# Patient Record
Sex: Female | Born: 1960 | Race: Black or African American | Hispanic: No | Smoking: Never smoker
Health system: Southern US, Community
[De-identification: ages and names within clinical notes are randomized; demographics above are authoritative.]

## PROBLEM LIST (undated history)

## (undated) DIAGNOSIS — Z825 Family history of asthma and other chronic lower respiratory diseases: Secondary | ICD-10-CM

## (undated) DIAGNOSIS — I10 Essential (primary) hypertension: Secondary | ICD-10-CM

## (undated) DIAGNOSIS — G43009 Migraine without aura, not intractable, without status migrainosus: Secondary | ICD-10-CM

## (undated) DIAGNOSIS — J309 Allergic rhinitis, unspecified: Secondary | ICD-10-CM

## (undated) DIAGNOSIS — E0590 Thyrotoxicosis, unspecified without thyrotoxic crisis or storm: Secondary | ICD-10-CM

## (undated) DIAGNOSIS — R05 Cough: Secondary | ICD-10-CM

## (undated) DIAGNOSIS — R059 Cough, unspecified: Secondary | ICD-10-CM

## (undated) DIAGNOSIS — B079 Viral wart, unspecified: Secondary | ICD-10-CM

## (undated) DIAGNOSIS — Z780 Asymptomatic menopausal state: Secondary | ICD-10-CM

## (undated) DIAGNOSIS — J069 Acute upper respiratory infection, unspecified: Secondary | ICD-10-CM

## (undated) DIAGNOSIS — M25559 Pain in unspecified hip: Secondary | ICD-10-CM

## (undated) HISTORY — DX: Thyrotoxicosis, unspecified without thyrotoxic crisis or storm: E05.90

## (undated) HISTORY — DX: Pain in unspecified hip: M25.559

## (undated) HISTORY — DX: Viral wart, unspecified: B07.9

## (undated) HISTORY — DX: Essential (primary) hypertension: I10

## (undated) HISTORY — DX: Allergic rhinitis, unspecified: J30.9

## (undated) HISTORY — DX: Cough: R05

## (undated) HISTORY — DX: Acute upper respiratory infection, unspecified: J06.9

## (undated) HISTORY — DX: Cough, unspecified: R05.9

## (undated) HISTORY — DX: Family history of asthma and other chronic lower respiratory diseases: Z82.5

## (undated) HISTORY — DX: Asymptomatic menopausal state: Z78.0

## (undated) HISTORY — DX: Migraine without aura, not intractable, without status migrainosus: G43.009

---

## 1998-06-26 ENCOUNTER — Emergency Department (HOSPITAL_COMMUNITY): Admission: EM | Admit: 1998-06-26 | Discharge: 1998-06-26 | Payer: Self-pay | Admitting: Emergency Medicine

## 1998-07-29 ENCOUNTER — Other Ambulatory Visit: Admission: RE | Admit: 1998-07-29 | Discharge: 1998-07-29 | Payer: Self-pay | Admitting: *Deleted

## 1999-07-08 ENCOUNTER — Other Ambulatory Visit: Admission: RE | Admit: 1999-07-08 | Discharge: 1999-07-08 | Payer: Self-pay | Admitting: *Deleted

## 1999-07-20 ENCOUNTER — Encounter: Payer: Self-pay | Admitting: *Deleted

## 1999-07-20 ENCOUNTER — Encounter: Admission: RE | Admit: 1999-07-20 | Discharge: 1999-07-20 | Payer: Self-pay | Admitting: *Deleted

## 2000-07-03 ENCOUNTER — Other Ambulatory Visit: Admission: RE | Admit: 2000-07-03 | Discharge: 2000-07-03 | Payer: Self-pay | Admitting: *Deleted

## 2004-06-30 ENCOUNTER — Ambulatory Visit: Payer: Self-pay | Admitting: Internal Medicine

## 2004-06-30 ENCOUNTER — Inpatient Hospital Stay (HOSPITAL_COMMUNITY): Admission: EM | Admit: 2004-06-30 | Discharge: 2004-07-01 | Payer: Self-pay | Admitting: Emergency Medicine

## 2004-07-07 ENCOUNTER — Ambulatory Visit: Payer: Self-pay | Admitting: Internal Medicine

## 2004-07-14 ENCOUNTER — Inpatient Hospital Stay (HOSPITAL_COMMUNITY): Admission: EM | Admit: 2004-07-14 | Discharge: 2004-07-20 | Payer: Self-pay | Admitting: Internal Medicine

## 2004-07-14 ENCOUNTER — Ambulatory Visit: Payer: Self-pay | Admitting: Internal Medicine

## 2004-07-14 ENCOUNTER — Ambulatory Visit: Payer: Self-pay | Admitting: Gastroenterology

## 2004-07-28 ENCOUNTER — Ambulatory Visit: Payer: Self-pay | Admitting: Internal Medicine

## 2004-08-03 ENCOUNTER — Ambulatory Visit: Payer: Self-pay | Admitting: Internal Medicine

## 2004-08-10 ENCOUNTER — Ambulatory Visit: Payer: Self-pay | Admitting: Internal Medicine

## 2004-08-10 ENCOUNTER — Inpatient Hospital Stay (HOSPITAL_COMMUNITY): Admission: EM | Admit: 2004-08-10 | Discharge: 2004-08-13 | Payer: Self-pay | Admitting: Emergency Medicine

## 2004-08-11 ENCOUNTER — Ambulatory Visit: Payer: Self-pay | Admitting: Internal Medicine

## 2004-08-22 ENCOUNTER — Ambulatory Visit: Payer: Self-pay | Admitting: Internal Medicine

## 2004-10-31 ENCOUNTER — Ambulatory Visit: Payer: Self-pay | Admitting: Internal Medicine

## 2005-05-05 ENCOUNTER — Ambulatory Visit: Payer: Self-pay | Admitting: Internal Medicine

## 2005-08-16 ENCOUNTER — Other Ambulatory Visit: Admission: RE | Admit: 2005-08-16 | Discharge: 2005-08-16 | Payer: Self-pay | Admitting: Obstetrics and Gynecology

## 2006-07-04 IMAGING — US US ABDOMEN COMPLETE
1 series · 14 of 25 positions shown · non-contrast
Comparison: CT 07/16/2004

CLINICAL DATA: Elevated amylase and lipase, nausea and vomiting, severe
abdominal pain

ABDOMEN ULTRASOUND:
TECHNIQUE: Complete abdominal ultrasound examination was performed including
evaluation of the liver, gallbladder, bile ducts, pancreas, kidneys, spleen,
IVC, and abdominal aorta.

[Series 1: unknown · 0.28mm/px · 14 of 83 slices shown]
[im 1/83]
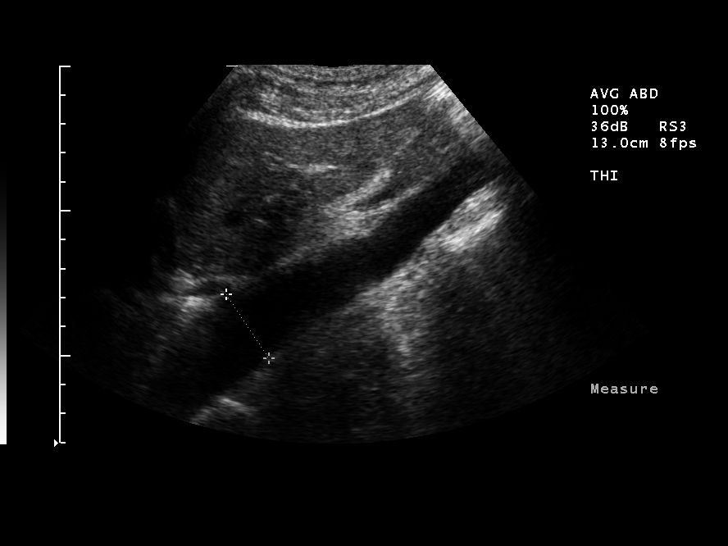
[im 7/83]
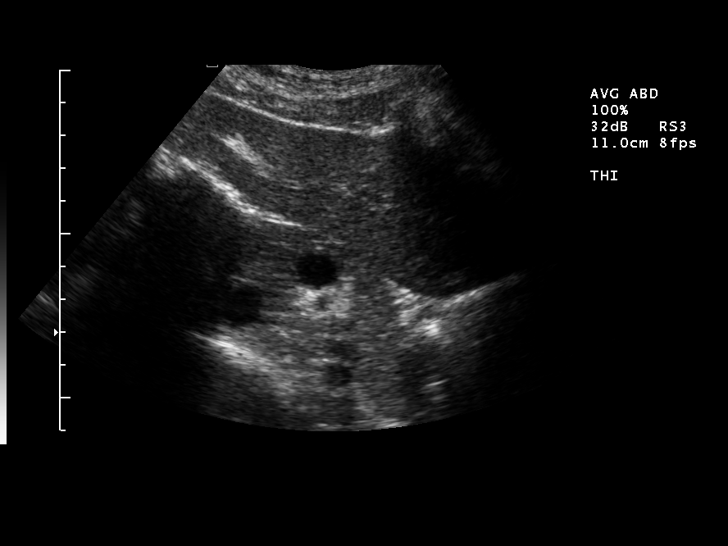
[im 14/83]
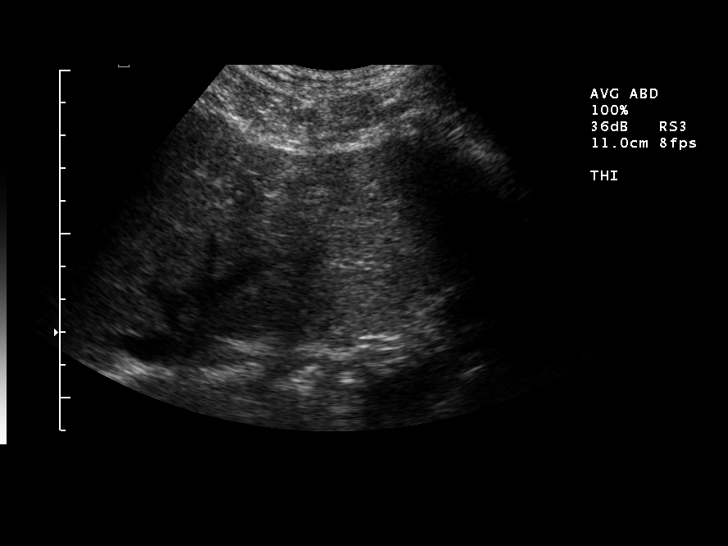
[im 21/83]
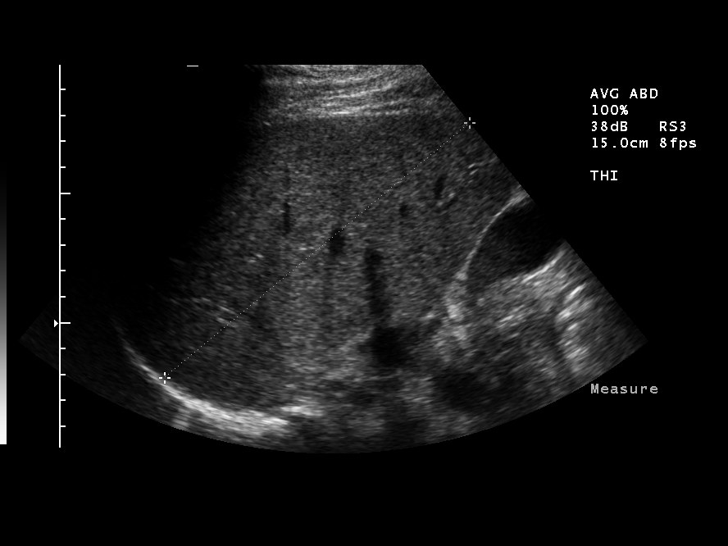
[im 28/83]
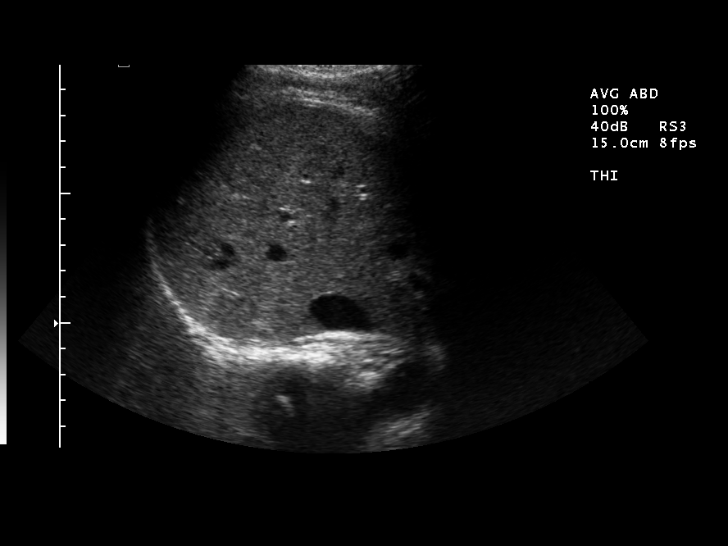
[im 31/83]
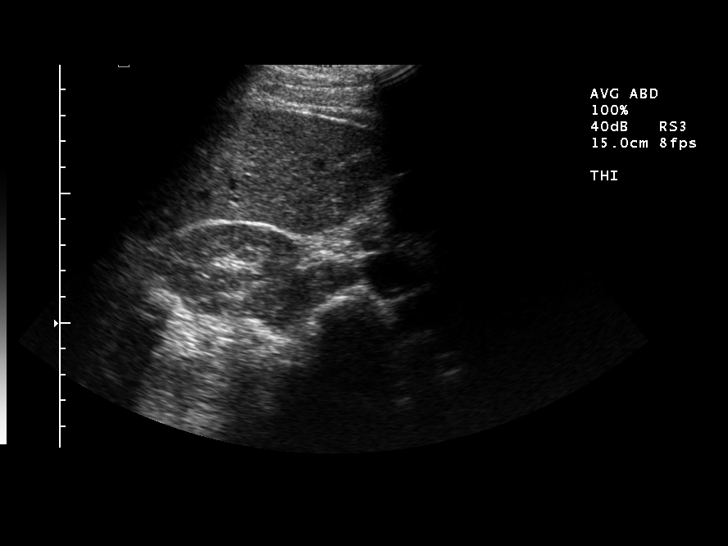
[im 38/83]
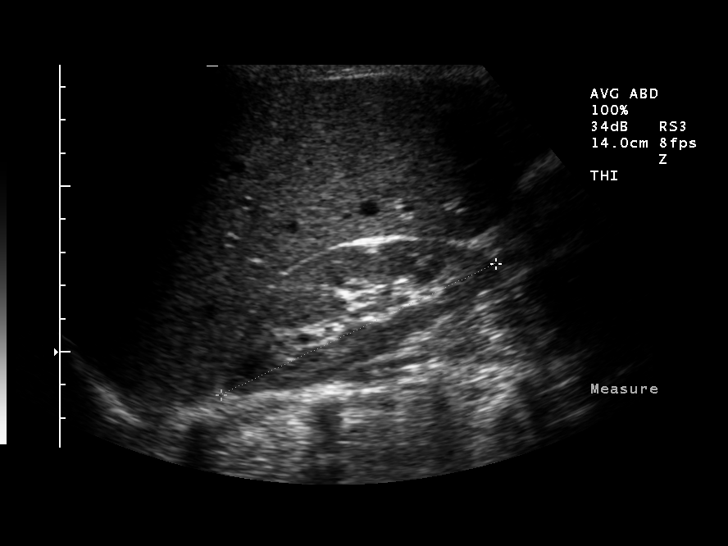
[im 45/83]
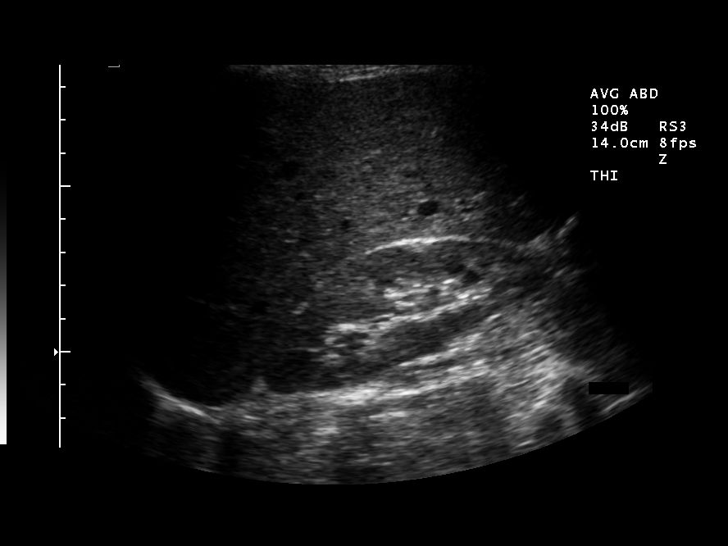
[im 52/83]
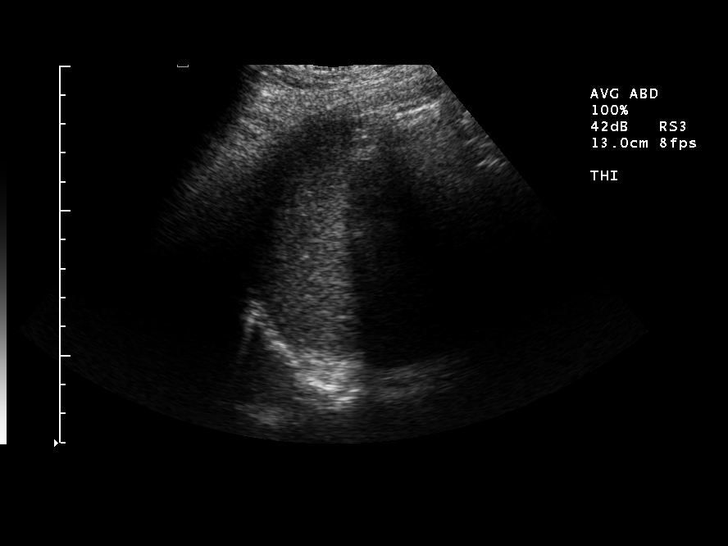
[im 55/83]
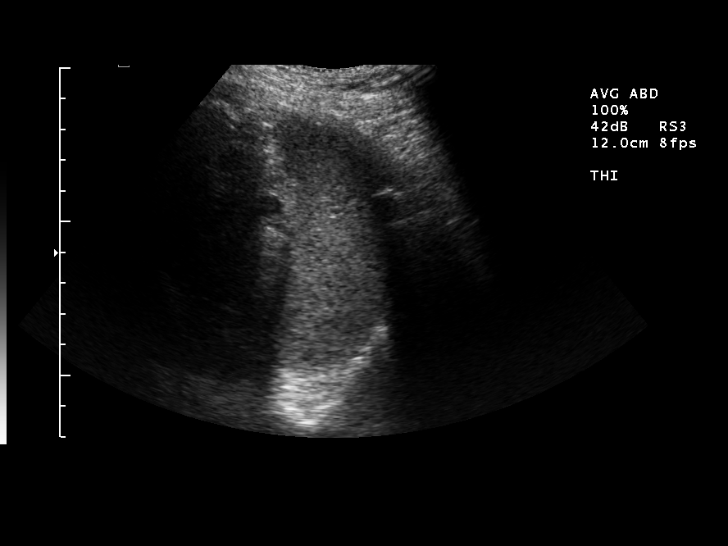
[im 62/83]
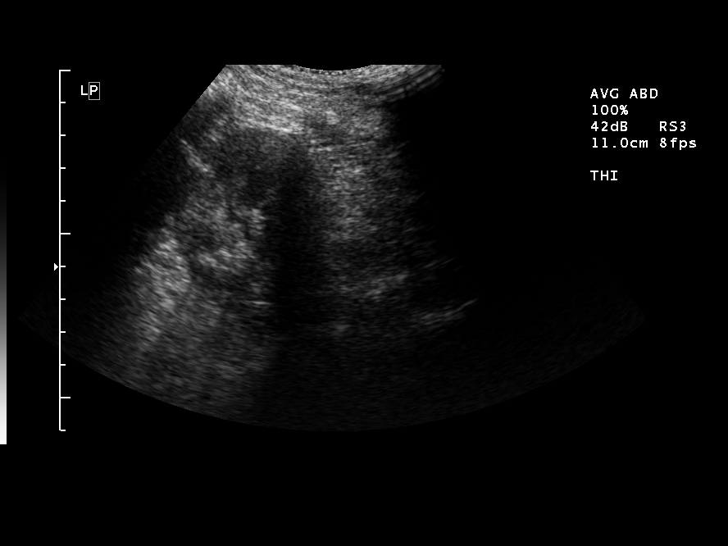
[im 69/83]
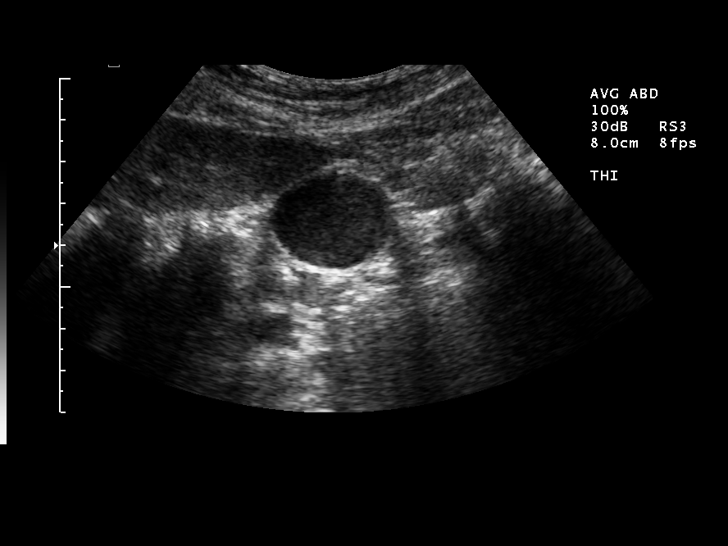
[im 76/83]
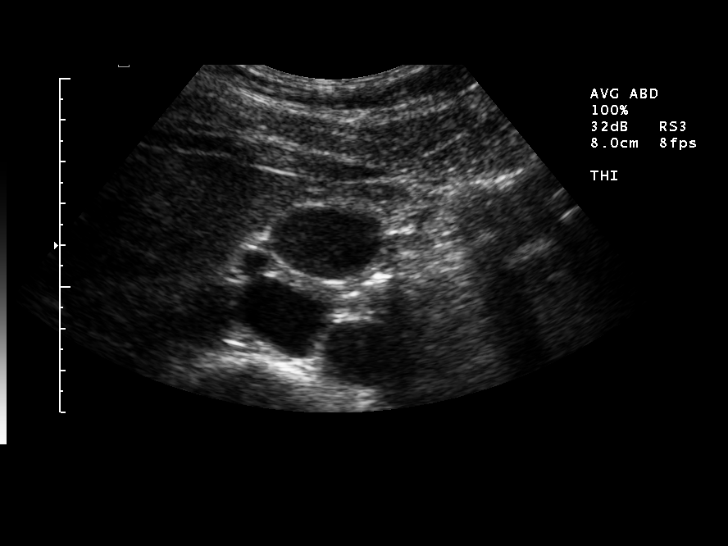
[im 83/83]
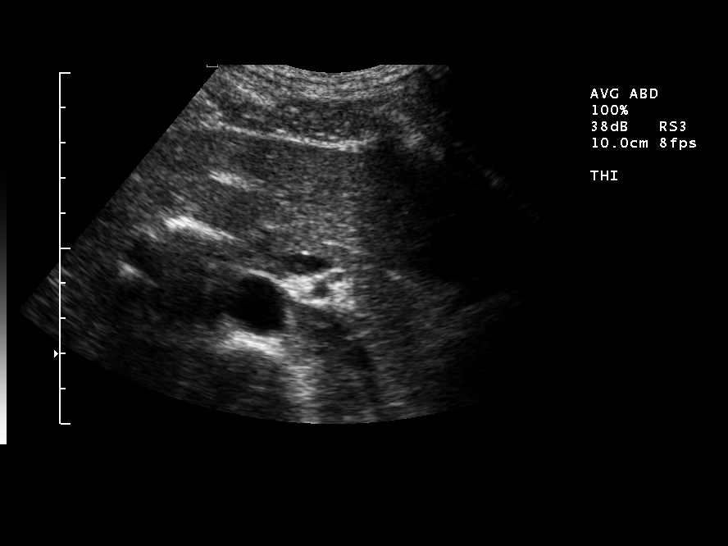

[14 of 25 positions shown; findings below may reference images not displayed]

FINDINGS: Gallbladder: Sludge present, no stones. Gallbladder wall normal.

Common bile duct: Normal, 2.6 mm.

Liver: Normal. 

IVC: Normal. 

Pancreas: Normal.

Spleen: Normal.

Right kidney: Normal,  9.6 cm.

Left kidney: Normal, 9.5 cm

Abdominal aorta: Normal.
IMPRESSION: Sludge within the gallbladder. No evidence of gallstones or acute cholecystitis.

## 2006-08-03 ENCOUNTER — Ambulatory Visit: Payer: Self-pay | Admitting: Internal Medicine

## 2006-08-03 LAB — CONVERTED CEMR LAB
ALT: 12 units/L (ref 0–40)
AST: 19 units/L (ref 0–37)
Albumin: 3.4 g/dL — ABNORMAL LOW (ref 3.5–5.2)
Basophils Absolute: 0 10*3/uL (ref 0.0–0.1)
Basophils Relative: 0.8 % (ref 0.0–1.0)
Calcium: 9.1 mg/dL (ref 8.4–10.5)
Chloride: 111 meq/L (ref 96–112)
Eosinophils Relative: 1.1 % (ref 0.0–5.0)
GFR calc non Af Amer: 72 mL/min
HCT: 36.7 % (ref 36.0–46.0)
Hemoglobin, Urine: NEGATIVE
LDL Cholesterol: 29 mg/dL (ref 0–99)
MCHC: 33.2 g/dL (ref 30.0–36.0)
Neutrophils Relative %: 57.9 % (ref 43.0–77.0)
RBC: 4.4 M/uL (ref 3.87–5.11)
RDW: 13.7 % (ref 11.5–14.6)
Specific Gravity, Urine: 1.01 (ref 1.000–1.03)
Total CHOL/HDL Ratio: 1.5
Total Protein, Urine: NEGATIVE mg/dL
VLDL: 10 mg/dL (ref 0–40)
WBC: 5.5 10*3/uL (ref 4.5–10.5)
pH: 6 (ref 5.0–8.0)

## 2007-05-22 ENCOUNTER — Telehealth (INDEPENDENT_AMBULATORY_CARE_PROVIDER_SITE_OTHER): Payer: Self-pay | Admitting: *Deleted

## 2007-05-23 ENCOUNTER — Ambulatory Visit: Payer: Self-pay | Admitting: Internal Medicine

## 2007-05-23 DIAGNOSIS — J309 Allergic rhinitis, unspecified: Secondary | ICD-10-CM | POA: Insufficient documentation

## 2007-05-23 DIAGNOSIS — M25559 Pain in unspecified hip: Secondary | ICD-10-CM

## 2007-05-23 DIAGNOSIS — I10 Essential (primary) hypertension: Secondary | ICD-10-CM | POA: Insufficient documentation

## 2007-05-23 DIAGNOSIS — J069 Acute upper respiratory infection, unspecified: Secondary | ICD-10-CM | POA: Insufficient documentation

## 2007-08-29 ENCOUNTER — Telehealth: Payer: Self-pay | Admitting: Internal Medicine

## 2007-09-06 ENCOUNTER — Telehealth: Payer: Self-pay | Admitting: Internal Medicine

## 2007-09-21 ENCOUNTER — Ambulatory Visit: Payer: Self-pay | Admitting: Family Medicine

## 2007-09-21 DIAGNOSIS — G43009 Migraine without aura, not intractable, without status migrainosus: Secondary | ICD-10-CM | POA: Insufficient documentation

## 2007-09-23 ENCOUNTER — Telehealth: Payer: Self-pay | Admitting: Internal Medicine

## 2007-12-09 ENCOUNTER — Ambulatory Visit: Payer: Self-pay | Admitting: Internal Medicine

## 2007-12-09 DIAGNOSIS — R05 Cough: Secondary | ICD-10-CM

## 2007-12-13 LAB — CONVERTED CEMR LAB
Alkaline Phosphatase: 135 units/L — ABNORMAL HIGH (ref 39–117)
Basophils Absolute: 0 10*3/uL (ref 0.0–0.1)
Bilirubin Urine: NEGATIVE
Bilirubin, Direct: 0.1 mg/dL (ref 0.0–0.3)
GFR calc Af Amer: 87 mL/min
Glucose, Bld: 96 mg/dL (ref 70–99)
HCT: 36.5 % (ref 36.0–46.0)
HDL: 69.2 mg/dL (ref >39.0)
Hemoglobin, Urine: NEGATIVE
Leukocytes, UA: NEGATIVE
Lymphocytes Relative: 37.9 % (ref 12.0–46.0)
Monocytes Absolute: 0.5 10*3/uL (ref 0.1–1.0)
Monocytes Relative: 7.9 % (ref 3.0–12.0)
Nitrite: NEGATIVE
Platelets: 238 10*3/uL (ref 150–400)
Potassium: 3.9 meq/L (ref 3.5–5.1)
RDW: 13.2 % (ref 11.5–14.6)
Sodium: 142 meq/L (ref 135–145)
Total Bilirubin: 0.6 mg/dL (ref 0.3–1.2)
Total CHOL/HDL Ratio: 1.7
Total Protein: 7.3 g/dL (ref 6.0–8.3)
VLDL: 8 mg/dL (ref 0–40)

## 2008-01-09 ENCOUNTER — Ambulatory Visit: Payer: Self-pay | Admitting: Internal Medicine

## 2008-01-10 ENCOUNTER — Encounter: Payer: Self-pay | Admitting: Endocrinology

## 2008-01-10 LAB — CONVERTED CEMR LAB
ALT: 26 units/L (ref 0–35)
AST: 30 units/L (ref 0–37)
Albumin: 3.7 g/dL (ref 3.5–5.2)
Alkaline Phosphatase: 136 units/L — ABNORMAL HIGH (ref 39–117)
BUN: 8 mg/dL (ref 6–23)
Bilirubin, Direct: 0.1 mg/dL (ref 0.0–0.3)
CO2: 28 meq/L (ref 19–32)
Chloride: 108 meq/L (ref 96–112)
GFR calc Af Amer: 77 mL/min
Glucose, Bld: 101 mg/dL — ABNORMAL HIGH (ref 70–99)
Potassium: 3.9 meq/L (ref 3.5–5.1)
Total Protein: 6.8 g/dL (ref 6.0–8.3)

## 2008-01-14 ENCOUNTER — Telehealth: Payer: Self-pay | Admitting: Internal Medicine

## 2008-02-10 ENCOUNTER — Ambulatory Visit: Payer: Self-pay | Admitting: Endocrinology

## 2008-02-17 ENCOUNTER — Encounter (HOSPITAL_COMMUNITY): Admission: RE | Admit: 2008-02-17 | Discharge: 2008-03-05 | Payer: Self-pay | Admitting: Endocrinology

## 2008-02-28 ENCOUNTER — Ambulatory Visit: Payer: Self-pay | Admitting: Endocrinology

## 2008-02-28 DIAGNOSIS — Z780 Asymptomatic menopausal state: Secondary | ICD-10-CM | POA: Insufficient documentation

## 2008-03-06 ENCOUNTER — Ambulatory Visit (HOSPITAL_COMMUNITY): Admission: RE | Admit: 2008-03-06 | Discharge: 2008-03-06 | Payer: Self-pay | Admitting: Endocrinology

## 2008-05-01 ENCOUNTER — Ambulatory Visit: Payer: Self-pay | Admitting: Endocrinology

## 2008-05-01 DIAGNOSIS — E0500 Thyrotoxicosis with diffuse goiter without thyrotoxic crisis or storm: Secondary | ICD-10-CM | POA: Insufficient documentation

## 2008-05-01 DIAGNOSIS — E039 Hypothyroidism, unspecified: Secondary | ICD-10-CM | POA: Insufficient documentation

## 2008-05-29 ENCOUNTER — Ambulatory Visit: Payer: Self-pay | Admitting: Endocrinology

## 2008-06-07 ENCOUNTER — Emergency Department (HOSPITAL_COMMUNITY): Admission: EM | Admit: 2008-06-07 | Discharge: 2008-06-07 | Payer: Self-pay | Admitting: Emergency Medicine

## 2008-06-23 ENCOUNTER — Telehealth (INDEPENDENT_AMBULATORY_CARE_PROVIDER_SITE_OTHER): Payer: Self-pay | Admitting: *Deleted

## 2008-07-13 ENCOUNTER — Ambulatory Visit: Payer: Self-pay | Admitting: Endocrinology

## 2008-07-13 LAB — CONVERTED CEMR LAB: TSH: 0.07 microintl units/mL — ABNORMAL LOW (ref 0.35–5.50)

## 2008-10-23 ENCOUNTER — Ambulatory Visit: Payer: Self-pay | Admitting: Endocrinology

## 2008-10-23 LAB — CONVERTED CEMR LAB: TSH: 0.03 microintl units/mL — ABNORMAL LOW (ref 0.35–5.50)

## 2009-02-01 ENCOUNTER — Telehealth: Payer: Self-pay | Admitting: Endocrinology

## 2009-02-02 ENCOUNTER — Telehealth: Payer: Self-pay | Admitting: Internal Medicine

## 2009-02-19 ENCOUNTER — Ambulatory Visit: Payer: Self-pay | Admitting: Internal Medicine

## 2009-02-19 DIAGNOSIS — B079 Viral wart, unspecified: Secondary | ICD-10-CM | POA: Insufficient documentation

## 2009-02-23 LAB — CONVERTED CEMR LAB
Chloride: 109 meq/L (ref 96–112)
GFR calc non Af Amer: 76.36 mL/min (ref >60)
Potassium: 4 meq/L (ref 3.5–5.1)
TSH: 0.14 microintl units/mL — ABNORMAL LOW (ref 0.35–5.50)

## 2009-02-24 ENCOUNTER — Telehealth: Payer: Self-pay | Admitting: Internal Medicine

## 2009-02-25 ENCOUNTER — Telehealth: Payer: Self-pay | Admitting: Internal Medicine

## 2009-03-10 ENCOUNTER — Telehealth: Payer: Self-pay | Admitting: Internal Medicine

## 2009-04-02 ENCOUNTER — Ambulatory Visit: Payer: Self-pay | Admitting: Internal Medicine

## 2009-04-02 LAB — CONVERTED CEMR LAB
CO2: 29 meq/L (ref 19–32)
Calcium: 9.1 mg/dL (ref 8.4–10.5)
GFR calc non Af Amer: 76.32 mL/min (ref >60)
Glucose, Bld: 98 mg/dL (ref 70–99)
Potassium: 3.8 meq/L (ref 3.5–5.1)
Sodium: 138 meq/L (ref 135–145)

## 2009-04-12 ENCOUNTER — Telehealth: Payer: Self-pay | Admitting: Internal Medicine

## 2009-05-21 ENCOUNTER — Ambulatory Visit: Payer: Self-pay | Admitting: Internal Medicine

## 2009-06-04 ENCOUNTER — Telehealth: Payer: Self-pay | Admitting: Internal Medicine

## 2009-07-14 ENCOUNTER — Telehealth: Payer: Self-pay | Admitting: Internal Medicine

## 2009-08-17 ENCOUNTER — Telehealth: Payer: Self-pay | Admitting: Internal Medicine

## 2009-08-19 ENCOUNTER — Ambulatory Visit: Payer: Self-pay | Admitting: Internal Medicine

## 2009-10-25 ENCOUNTER — Telehealth: Payer: Self-pay | Admitting: Internal Medicine

## 2009-11-09 ENCOUNTER — Telehealth: Payer: Self-pay | Admitting: Internal Medicine

## 2010-01-17 ENCOUNTER — Ambulatory Visit: Payer: Self-pay | Admitting: Internal Medicine

## 2010-01-18 ENCOUNTER — Encounter: Payer: Self-pay | Admitting: Internal Medicine

## 2010-01-18 LAB — CONVERTED CEMR LAB
ALT: 21 units/L (ref 0–35)
AST: 28 units/L (ref 0–37)
Albumin: 4.1 g/dL (ref 3.5–5.2)
BUN: 13 mg/dL (ref 6–23)
Chloride: 102 meq/L (ref 96–112)
Cholesterol: 144 mg/dL (ref 0–200)
Eosinophils Relative: 1.5 % (ref 0.0–5.0)
GFR calc non Af Amer: 69.6 mL/min (ref >60)
Glucose, Bld: 95 mg/dL (ref 70–99)
HCT: 35.4 % — ABNORMAL LOW (ref 36.0–46.0)
Hemoglobin: 12.1 g/dL (ref 12.0–15.0)
Lymphs Abs: 3.3 10*3/uL (ref 0.7–4.0)
MCV: 82.6 fL (ref 78.0–100.0)
Monocytes Absolute: 0.7 10*3/uL (ref 0.1–1.0)
Monocytes Relative: 9.6 % (ref 3.0–12.0)
Neutro Abs: 3.2 10*3/uL (ref 1.4–7.7)
Platelets: 239 10*3/uL (ref 150.0–400.0)
Potassium: 3.3 meq/L — ABNORMAL LOW (ref 3.5–5.1)
Sodium: 141 meq/L (ref 135–145)
TSH: 2.52 microintl units/mL (ref 0.35–5.50)
Total Protein: 7.4 g/dL (ref 6.0–8.3)
WBC: 7.3 10*3/uL (ref 4.5–10.5)

## 2010-04-25 ENCOUNTER — Telehealth: Payer: Self-pay | Admitting: Internal Medicine

## 2010-07-11 ENCOUNTER — Encounter: Payer: Self-pay | Admitting: Endocrinology

## 2010-07-19 NOTE — Progress Notes (Signed)
Summary: vitamin b12  Phone Note Call from Patient   Caller: Daughter-Angela [phone] Summary of Call: Patient daughter called stating that she would like to know if ok for her to use a vitamin b12 suppliment? Please advise Initial call taken by: Rock Nephew CMA,  Oct 25, 2009 3:39 PM  Follow-up for Phone Call        ok Follow-up by: Tresa Garter MD,  Oct 25, 2009 11:13 PM  Additional Follow-up for Phone Call Additional follow up Details #1::        left mess to call office back ..........Marland KitchenLamar Sprinkles, CMA  Oct 26, 2009 8:34 AM   Pt informed  Additional Follow-up by: Lamar Sprinkles, CMA,  Oct 26, 2009 1:53 PM

## 2010-07-19 NOTE — Miscellaneous (Signed)
  Clinical Lists Changes  Medications: Added new medication of KLOR-CON M20 20 MEQ CR-TABS (POTASSIUM CHLORIDE CRYS CR) 1 once daily - Signed Rx of KLOR-CON M20 20 MEQ CR-TABS (POTASSIUM CHLORIDE CRYS CR) 1 once daily;  #30 x 6;  Signed;  Entered by: Lanier Prude, CMA(AAMA);  Authorized by: Tresa Garter MD;  Method used: Electronically to [facility] [address], [place], [place], [place]  [address], Ph: [phone], Fax: [phone]    Prescriptions: KLOR-CON M20 20 MEQ CR-TABS (POTASSIUM CHLORIDE CRYS CR) 1 once daily  #30 x 6   Entered by:   Lanier Prude, CMA(AAMA)   Authorized by:   Tresa Garter MD   Signed by:   Lanier Prude, [facility]) on 01/18/2010   Method used:   Electronically to        [facility] (retail)       [address]       [place], [place]  [address]       Ph: [phone]       Fax: [phone]   RxID:   [id number]

## 2010-07-19 NOTE — Progress Notes (Signed)
Summary: OV THURSDAY  Phone Note Call from Patient   Summary of Call: PT's daughter called. Pt c/o chills, cough and sinus congestion. I advised as previous phone note for symptom management. Scheduled for office visit Thursday am if not better tomorrow, also asked for her to call office if symptoms become worse. Daughter agreed.  Initial call taken by: Lamar Sprinkles, CMA,  August 17, 2009 5:44 PM  Follow-up for Phone Call        Agree. Thx Follow-up by: Tresa Garter MD,  August 18, 2009 1:18 PM

## 2010-07-19 NOTE — Progress Notes (Signed)
  Phone Note Refill Request Message from:  Patient on Nov 09, 2009 9:03 AM  Refills Requested: Medication #1:  SYNTHROID 75 MCG TABS 1/2 once daily Pt requesting refill on this medication. Should you refilll or Dr Everardo All.   Initial call taken by: Ami Bullins CMA,  Nov 09, 2009 9:03 AM  Follow-up for Phone Call        ok 12 ref Follow-up by: Tresa Garter MD,  Nov 09, 2009 5:42 PM    Prescriptions: SYNTHROID 75 MCG TABS (LEVOTHYROXINE SODIUM) 1/2 once daily  #45 x 3   Entered by:   Lamar Sprinkles, CMA   Authorized by:   Tresa Garter MD   Signed by:   Lamar Sprinkles, CMA on 11/10/2009   Method used:   Electronically to        [facility] (retail)       [address]       [place], [place]  [address]       Ph: [phone]       Fax: [phone]   RxID:   [id number]   Appended Document:  Daughter is aware

## 2010-07-19 NOTE — Progress Notes (Signed)
Summary: samples  Phone Note Refill Request Message from:  Patient  Refills Requested: Medication #1:  TRIBENZOR 40-10-25 MG TABS 1 by mouth qd   Last Refilled: 05/21/2009 Pt's daughter, Tyniah Kastens, called to see if we had samples of BP meds.  Initial call taken by: Alysia Penna,  April 25, 2010 1:47 PM  Follow-up for Phone Call        samples pulled. called pt back. no answer.  Follow-up by: Alysia Penna,  April 25, 2010 1:51 PM  Additional Follow-up for Phone Call Additional follow up Details #1::        Pt advised via VM that req'd sample are available for pick up Additional Follow-up by: Margaret Pyle, CMA,  April 26, 2010 10:46 AM    Prescriptions: Marya Landry 40-10-25 MG TABS [facility]) 1 by mouth qd  #14 x 0   Entered by:   Alysia Penna   Authorized by:   Tresa Garter MD   Signed by:   Alysia Penna on 04/25/2010   Method used:   Samples Given   RxID:   [id number]   Appended Document: samples Pt informed to pick up

## 2010-07-19 NOTE — Assessment & Plan Note (Signed)
Summary: cough, sinus congestion / SD   Vital Signs:  Patient profile:   50 year old female Weight:      170 pounds BMI:     30.22 Temp:     98.2 degrees F oral Pulse rate:   109 / minute BP sitting:   114 / 88  (left arm)  Vitals Entered By: Tora Perches (August 19, 2009 8:30 AM)  Procedure Note  Wart Removal: The patient complains of irritation. Consent signed: no  Procedure # 1: cryotherapy    Region: Palmar and dorsal     Location: B hands    # lesions removed: 15    Technique: liquid N2    Comment: Risks including but not limited by incomplete procedure, bleeding, infection, recurrence were discussed with the patient. verbal  Consent  was obtained..   Additional Instructions: Tolerated well. Complicatons - none.   CC: cough, sinus Is Patient Diabetic? No   CC:  cough and sinus.  History of Present Illness: The patient presents with complaints of sore throat, fever, cough, sinus congestion and drainge of several days duration. Not better with OTC meds. Chest hurts with coughing. Can't sleep due to cough. Muscle aches are present.  The mucus is colored.  C/o warts  Preventive Screening-Counseling & Management  Alcohol-Tobacco     Smoking Status: never  Current Medications (verified): 1)  Tribenzor 40-10-25 Mg Tabs (Olmesartan-Amlodipine-Hctz) .Marland Kitchen.. 1 By Mouth Qd 2)  Naproxen 500 Mg Tabs (Naproxen) .Marland Kitchen.. 1 By Mouth Two Times A Day Pc Prn 3)  Lotrel 5-20 Mg  Caps (Amlodipine Besy-Benazepril Hcl) .... 2 By Mouth Qd 4)  Flonase 50 Mcg/act  Susp (Fluticasone Propionate) .... 2 Sprays in Each Nostril Daily 5)  Zyrtec Allergy 10 Mg  Tabs (Cetirizine Hcl) .Marland Kitchen.. 1 By Mouth Qd 6)  Estradiol 0.5 Mg Tabs (Estradiol) .... Once Daily 7)  Medroxyprogesterone Acetate 2.5 Mg Tabs (Medroxyprogesterone Acetate) .... Once Daily 8)  Synthroid 75 Mcg Tabs (Levothyroxine Sodium) .... 1/2 Once Daily 9)  Vitamin E 400 Unit Caps (Vitamin E) .Marland Kitchen.. 1 By Mouth Qd 10)  Vitamin D3 1000 Unit   Tabs (Cholecalciferol) .Marland Kitchen.. 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  Lungs:  clear to auscultation.  no respiratory distress  Heart:  regular rate and rhythm, S1, S2 without murmurs, rubs, gallops, or clicks Abdomen:  Bowel sounds positive,abdomen soft and non-tender without masses, organomegaly or hernias noted. Skin:  hand warts B x 15 Psych:  Oriented X3.     Impression & Recommendations:  Problem # 1:  UPPER RESPIRATORY INFECTION (URI) (ICD-465.9) Assessment New  Her updated medication list for this problem includes:    Naproxen 500 Mg Tabs (Naproxen) .Marland Kitchen... 1 by mouth two times a day pc prn    Zyrtec Allergy 10 Mg Tabs (Cetirizine hcl) .Marland Kitchen... 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 1 by mouth qd 2)  Naproxen 500 Mg Tabs (Naproxen) .Marland Kitchen.. 1 by mouth two times a day pc prn 3)  Lotrel  5-20 Mg Caps (Amlodipine besy-benazepril hcl) .... 2 by mouth qd 4)  Flonase 50 Mcg/act Susp (Fluticasone propionate) .... 2 sprays in each nostril daily 5)  Zyrtec Allergy 10 Mg Tabs (Cetirizine hcl) .Marland Kitchen.. 1 by mouth qd 6)  Estradiol 0.5 Mg Tabs (Estradiol) .... Once daily 7)  Medroxyprogesterone Acetate 2.5 Mg Tabs (Medroxyprogesterone acetate) .... Once daily 8)  Synthroid 75 Mcg Tabs (Levothyroxine sodium) .... 1/2 once daily 9)  Vitamin E 400 Unit Caps (Vitamin e) .Marland Kitchen.. 1 by mouth qd 10)  Vitamin D3 1000 Unit Tabs (Cholecalciferol) .Marland Kitchen.. 1 qd 11)  Proair Hfa 108 (90 Base) Mcg/act Aers (Albuterol sulfate) .... 2 inh qid as needed 12)  Zithromax Z-pak 250 Mg Tabs (Azithromycin) .... As dirrected 13)  Tessalon Perles 100 Mg Caps (Benzonatate) .Marland Kitchen.. 1-2 by mouth two times a day as needed cogh  Patient Instructions: 1)  Call if you are not better in a reasonable amount of time or if worse.  2)  Please schedule a follow-up appointment in 6 months well. Prescriptions: TESSALON PERLES 100 MG CAPS (BENZONATATE) 1-2 by mouth two times a day as needed cogh  #120 x 1   Entered and Authorized by:   Tresa Garter MD   Signed by:   Tresa Garter MD on 08/19/2009   Method used:   Print then Give to Patient   RxID:   [id number] NFAOZHYQM Z-PAK 250 MG TABS (AZITHROMYCIN) as dirrected  #1 x 0   Entered and Authorized by:   Tresa Garter MD   Signed by:   Tresa Garter MD on 08/19/2009   Method used:   Print then Give to Patient   RxID:   [id number] PROAIR HFA 108 (90 BASE) MCG/ACT AERS (ALBUTEROL SULFATE) 2 inh qid as needed  #3 x 3   Entered and Authorized by:   Tresa Garter MD   Signed by:   Tresa Garter MD on 08/19/2009   Method used:   Print then Give to Patient   RxID:   [id number]

## 2010-07-19 NOTE — Progress Notes (Signed)
Summary: flu symptoms  Phone Note Call from Patient Call back at Work Phone [phone]   Summary of Call: Patient daughter left message on triage that her mother has been having flu like symptoms for several days. Per daughter, the patient has had chills, body aches, fever, runny nose, and coughing up green flem. Also, per daughter, the patient was coughing up some blood a few days ago, and blood has since stopped. Please advise if the patient needs prescription or office visit? Initial call taken by: Lucious Groves,  July 14, 2009 1:28 PM  Follow-up for Phone Call        Use over-the-counter medicines for "cold": Tylenol  650mg  or Advil 400mg  every 6 hours  for fever; Delsym or Robutussin for cough. Mucinex or Mucinex D for congestion. Ricola or Halls for sore throat. Office visit if not better or if worse.  OV tomorrow if sick - any MD Follow-up by: Tresa Garter MD,  July 14, 2009 4:07 PM  Additional Follow-up for Phone Call Additional follow up Details #1::        left mess to call office back .....................Marland KitchenLamar Sprinkles, CMA  July 15, 2009 6:10 PM   left mess to call office back.........................Marland KitchenLamar Sprinkles, CMA  July 16, 2009 12:08 PM     Additional Follow-up for Phone Call Additional follow up Details #2::    Pt's daughter left vm with triage, req that we leave a vm for pt on wk # b/c pt is at wk till 6pm. Left vm wit above info Follow-up by: Lamar Sprinkles, CMA,  July 16, 2009 3:42 PM

## 2010-07-20 ENCOUNTER — Emergency Department (HOSPITAL_COMMUNITY)
Admission: EM | Admit: 2010-07-20 | Discharge: 2010-07-21 | Disposition: A | Discharge status: Home / Self Care | Payer: BC Managed Care – PPO | Attending: Emergency Medicine | Admitting: Emergency Medicine

## 2010-07-20 ENCOUNTER — Emergency Department (HOSPITAL_COMMUNITY): Payer: BC Managed Care – PPO

## 2010-07-20 DIAGNOSIS — R509 Fever, unspecified: Secondary | ICD-10-CM | POA: Insufficient documentation

## 2010-07-20 DIAGNOSIS — J189 Pneumonia, unspecified organism: Secondary | ICD-10-CM | POA: Insufficient documentation

## 2010-07-20 DIAGNOSIS — E039 Hypothyroidism, unspecified: Secondary | ICD-10-CM | POA: Insufficient documentation

## 2010-07-20 DIAGNOSIS — I10 Essential (primary) hypertension: Secondary | ICD-10-CM | POA: Insufficient documentation

## 2010-07-20 DIAGNOSIS — R000 Tachycardia, unspecified: Secondary | ICD-10-CM | POA: Insufficient documentation

## 2010-07-21 LAB — DIFFERENTIAL
Basophils Relative: 0 % (ref 0–1)
Lymphocytes Relative: 7 % — ABNORMAL LOW (ref 12–46)
Lymphs Abs: 1.3 10*3/uL (ref 0.7–4.0)
Monocytes Absolute: 1.5 10*3/uL — ABNORMAL HIGH (ref 0.1–1.0)
Monocytes Relative: 8 % (ref 3–12)
Neutro Abs: 15 10*3/uL — ABNORMAL HIGH (ref 1.7–7.7)
Neutrophils Relative %: 84 % — ABNORMAL HIGH (ref 43–77)

## 2010-07-21 LAB — CBC
HCT: 30.5 % — ABNORMAL LOW (ref 36.0–46.0)
Hemoglobin: 10.5 g/dL — ABNORMAL LOW (ref 12.0–15.0)
MCH: 27.3 pg (ref 26.0–34.0)
MCV: 79.4 fL (ref 78.0–100.0)
RBC: 3.84 MIL/uL — ABNORMAL LOW (ref 3.87–5.11)

## 2010-07-21 LAB — BASIC METABOLIC PANEL
CO2: 24 mEq/L (ref 19–32)
Chloride: 103 mEq/L (ref 96–112)
Creatinine, Ser: 1.22 mg/dL — ABNORMAL HIGH (ref 0.4–1.2)
GFR calc Af Amer: 57 mL/min — ABNORMAL LOW (ref >60)
Glucose, Bld: 124 mg/dL — ABNORMAL HIGH (ref 70–99)

## 2010-08-26 ENCOUNTER — Ambulatory Visit (INDEPENDENT_AMBULATORY_CARE_PROVIDER_SITE_OTHER): Payer: BC Managed Care – PPO | Admitting: Internal Medicine

## 2010-08-26 ENCOUNTER — Encounter: Payer: Self-pay | Admitting: Internal Medicine

## 2010-08-26 DIAGNOSIS — L719 Rosacea, unspecified: Secondary | ICD-10-CM

## 2010-08-26 DIAGNOSIS — M25519 Pain in unspecified shoulder: Secondary | ICD-10-CM | POA: Insufficient documentation

## 2010-08-26 DIAGNOSIS — E890 Postprocedural hypothyroidism: Secondary | ICD-10-CM

## 2010-08-26 DIAGNOSIS — G4700 Insomnia, unspecified: Secondary | ICD-10-CM | POA: Insufficient documentation

## 2010-08-26 DIAGNOSIS — I10 Essential (primary) hypertension: Secondary | ICD-10-CM

## 2010-08-26 DIAGNOSIS — M25512 Pain in left shoulder: Secondary | ICD-10-CM | POA: Insufficient documentation

## 2010-08-26 DIAGNOSIS — M79602 Pain in left arm: Secondary | ICD-10-CM | POA: Insufficient documentation

## 2010-08-26 DIAGNOSIS — B079 Viral wart, unspecified: Secondary | ICD-10-CM

## 2010-08-30 NOTE — Assessment & Plan Note (Signed)
Summary: f/u appt   Vital Signs:  Patient profile:   50 year old female Height:      63 inches Weight:      170 pounds BMI:     30.22 Temp:     98.4 degrees F oral Pulse rate:   84 / minute Pulse rhythm:   regular Resp:     16 per minute BP sitting:   112 / 90  (left arm) Cuff size:   regular  Vitals Entered By: Lanier Prude, Elspeth Gust) (August 26, 2010 4:13 PM) CC: f/u Is Patient Diabetic? No Comments pt is not taking naproxen, Lotrel, Flonase, Zyrtec, Estradiol or Medroxyprogesterone   CC:  f/u.  History of Present Illness: C/o insomnia F/u HTN, hypothyroidism C/o R shoulder pain C/o warts  Current Medications (verified): 1)  Tribenzor 40-10-25 Mg Tabs (Olmesartan-Amlodipine-Hctz) .Marland Kitchen.. 1 By Mouth Qd 2)  Naproxen 500 Mg Tabs (Naproxen) .Marland Kitchen.. 1 By Mouth Two Times A Day Pc Prn 3)  Lotrel 5-20 Mg  Caps (Amlodipine Besy-Benazepril Hcl) .... 2 By Mouth Qd 4)  Flonase 50 Mcg/act  Susp (Fluticasone Propionate) .... 2 Sprays in Each Nostril Daily 5)  Zyrtec Allergy 10 Mg  Tabs (Cetirizine Hcl) .Marland Kitchen.. 1 By Mouth Qd 6)  Estradiol 0.5 Mg Tabs (Estradiol) .... Once Daily 7)  Medroxyprogesterone Acetate 2.5 Mg Tabs (Medroxyprogesterone Acetate) .... Once Daily 8)  Synthroid 75 Mcg Tabs (Levothyroxine Sodium) .... 1/2 Once Daily 9)  Vitamin E 400 Unit Caps (Vitamin E) .Marland Kitchen.. 1 By Mouth Qd 10)  Vitamin D3 1000 Unit  Tabs (Cholecalciferol) .Marland Kitchen.. 1 Qd 11)  Proair Hfa 108 (90 Base) Mcg/act Aers (Albuterol Sulfate) .... 2 Inh Qid As Needed 12)  Tessalon Perles 100 Mg Caps (Benzonatate) .Marland Kitchen.. 1-2 By Mouth Two Times A Day As Needed Cogh 13)  Klor-Con M20 20 Meq Cr-Tabs (Potassium Chloride Crys Cr) .Marland Kitchen.. 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    Physical Exam  General:  Well-developed,well-nourished,in no acute distress; alert,appropriate and cooperative throughout examination Hoarse Nose:  External nasal examination shows no deformity or inflammation. Nasal mucosa are pink and moist without lesions or exudates. Mouth:  Erythematous throat mucosa and intranasal erythema.  Lungs:  clear to auscultation.  no respiratory distress  Heart:  regular rate and rhythm, S1, S2 without murmurs, rubs, gallops, or clicks Abdomen:  Bowel sounds positive,abdomen soft and non-tender without masses, organomegaly or hernias noted. Msk:  no deformity or scoliosis noted with normal posture and gait Neurologic:  no tremor Skin:  hand warts R x 8 Psych:  Oriented X3.     Impression & Recommendations:  Problem # 1:  INSOMNIA, CHRONIC (ICD-307.42) Assessment Deteriorated Try Nortryptyline  Problem # 2:  HYPERTENSION (ICD-401.9) Assessment: Unchanged  Her updated medication list for this problem includes:    Tribenzor 40-10-25 Mg Tabs (Olmesartan-amlodipine-hctz) .Marland Kitchen... 1 by mouth qd    Lotrel 5-20 Mg Caps (Amlodipine besy-benazepril hcl) .Marland Kitchen... 2 by mouth qd  BP today: 112/90 Prior BP: 114/88 (  08/19/2009)  Labs Reviewed: K+: 3.3 (01/17/2010) Creat: : 1.1 (01/17/2010)   Chol: 144 (01/17/2010)   HDL: 76.90 (01/17/2010)   LDL: 55 (01/17/2010)   TG: 62.0 (01/17/2010)  Problem # 3:  ALLERGIC RHINITIS (ICD-477.9) Assessment: Deteriorated  Her updated medication list for this problem includes:     Flonase 50 Mcg/act Susp (Fluticasone propionate) .Marland Kitchen... 2 sprays in each nostril daily    Zyrtec Allergy 10 Mg Tabs (Cetirizine hcl) .Marland Kitchen... 1 by mouth qd  Problem # 4:  HYPOTHYROIDISM, POST-RADIATION (ICD-244.1) Assessment: Unchanged  Her updated medication list for this problem includes:    Synthroid 75 Mcg Tabs (Levothyroxine sodium) .Marland Kitchen... 1/2 once daily  Labs Reviewed: TSH: 2.52 (01/17/2010)    Chol: 144 (01/17/2010)   HDL: 76.90 (01/17/2010)   LDL: 55 (01/17/2010)   TG: 62.0 (01/17/2010)  Problem # 5:  SHOULDER PAIN (ICD-719.41) R deltoid distal Assessment: New Do not use R arm to hold the door Her updated medication list for this problem includes:    Naproxen 500 Mg Tabs (Naproxen) .Marland Kitchen... 1 by mouth two times a day pc prn    Ibuprofen 600 Mg Tabs (Ibuprofen) .Marland Kitchen... 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 scar(s), incomplete removal, ect.  and benefits discussed    8  lesion(s) on R hand and wrist was/were treated with liqid N2 in usual fasion.  Tolerated well. Compl. none. Wound care instructions given.   Orders: Cryotherapy/Destruction benign or premalignant lesion (1st lesion)  (17000) Cryotherapy/Destruction benign or premalignant lesion (2nd-14th lesions) (17003)  Complete Medication List: 1)  Tribenzor 40-10-25 Mg Tabs (Olmesartan-amlodipine-hctz) .Marland Kitchen.. 1 by mouth qd 2)  Lotrel 5-20 Mg Caps (Amlodipine besy-benazepril hcl) .... 2 by mouth qd 3)  Flonase 50 Mcg/act Susp (Fluticasone propionate) .... 2 sprays in each nostril daily 4)  Zyrtec Allergy 10 Mg Tabs (Cetirizine hcl) .Marland Kitchen.. 1 by mouth qd 5)  Estradiol 0.5 Mg Tabs (Estradiol) .... Once daily 6)  Medroxyprogesterone Acetate 2.5 Mg Tabs (Medroxyprogesterone acetate) .... Once daily 7)  Synthroid 75 Mcg Tabs  (Levothyroxine sodium) .... 1/2 once daily 8)  Vitamin E 400 Unit Caps (Vitamin e) .Marland Kitchen.. 1 by mouth qd 9)  Vitamin D3 1000 Unit Tabs (Cholecalciferol) .Marland Kitchen.. 1 qd 10)  Proair Hfa 108 (90 Base) Mcg/act Aers (Albuterol sulfate) .... 2 inh qid as needed 11)  Tessalon Perles 100 Mg Caps (Benzonatate) .Marland Kitchen.. 1-2 by mouth two times a day as needed cogh 12)  Klor-con M20 20 Meq Cr-tabs (Potassium chloride crys cr) .Marland Kitchen.. 1 once daily 13)  Nortriptyline Hcl 10 Mg Caps (Nortriptyline hcl) .Marland Kitchen.. 1-2 by mouth qhs 14)  Ibuprofen 600 Mg Tabs (Ibuprofen) .Marland Kitchen.. 1 by mouth bid  pc x 1 wk then as needed for  pain 15)  Metronidazole 0.75 % Lotn (Metronidazole) .... On face qhs  Patient Instructions: 1)  Please schedule a follow-up appointment in 3 months well w/labs. Prescriptions: METRONIDAZOLE 0.75 % LOTN (METRONIDAZOLE) on face qhs  #90 g x 6   Entered and Authorized by:   Tresa Garter MD   Signed by:   Tresa Garter MD on 08/26/2010   Method used:   Electronically to        [facility] (retail)       [address]  [place], [place]  [address]       Ph: [phone]       Fax: [phone]   RxID:   [id number] IBUPROFEN 600 MG TABS (IBUPROFEN) 1 by mouth bid  pc x 1 wk then as needed for  pain  #60 x 3   Entered and Authorized by:   Tresa Garter MD   Signed by:   Tresa Garter MD on 08/26/2010   Method used:   Electronically to        [facility] (retail)       [address]       [place], [place]  [address]       Ph: [phone]       Fax: [phone]   RxID:   [id number] NORTRIPTYLINE HCL 10 MG CAPS (NORTRIPTYLINE HCL) 1-2 by mouth qhs  #60 x 6   Entered and Authorized by:   Tresa Garter MD   Signed by:   Tresa Garter MD on 08/26/2010   Method used:   Electronically to        [facility] (retail)       [address]       [place], [place]  [address]        Ph: [phone]       Fax: [phone]   RxID:   [id number]    Orders Added: 1)  Est. Patient Level IV [51761] 2)  Cryotherapy/Destruction benign or premalignant lesion (1st lesion)  [17000] 3)  Cryotherapy/Destruction benign or premalignant lesion (2nd-14th lesions) [17003]

## 2010-10-02 ENCOUNTER — Other Ambulatory Visit: Payer: Self-pay | Admitting: Internal Medicine

## 2010-11-03 ENCOUNTER — Telehealth: Payer: Self-pay | Admitting: *Deleted

## 2010-11-03 NOTE — Telephone Encounter (Signed)
Agree w/OV here or UC Thx

## 2010-11-03 NOTE — Telephone Encounter (Signed)
Pt left vm req advisement - she is c/o swelling in her lower leg also has area that is swollen and hot. 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 I attempted to call pt, no answer - left vm for her to call office office and schedule OV asap or go to UC

## 2010-11-03 NOTE — Telephone Encounter (Signed)
Pt called office - no apt's avail until Monday. She had swelling in her left lower leg and area that is hot to touch on her calf. School nurse examined pt and was worried about possible clot. Advised her to call first thing in AM for "same day" apt w/avail MD. 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 I advised pt if no apt avail tomorrow to go to UC.   Lamar Sprinkles, CMA 11/03/2010 3:51 PM Signed  Pt left vm req advisement - she is c/o swelling in her lower leg also has area that is swollen and hot. 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 I attempted to call pt, no answer - left vm for her to call office office and schedule OV asap or go to UC

## 2010-11-04 ENCOUNTER — Other Ambulatory Visit (INDEPENDENT_AMBULATORY_CARE_PROVIDER_SITE_OTHER): Payer: BC Managed Care – PPO

## 2010-11-04 ENCOUNTER — Other Ambulatory Visit: Payer: Self-pay | Admitting: *Deleted

## 2010-11-04 ENCOUNTER — Ambulatory Visit (INDEPENDENT_AMBULATORY_CARE_PROVIDER_SITE_OTHER): Payer: BC Managed Care – PPO | Admitting: Internal Medicine

## 2010-11-04 VITALS — BP 118/84 | HR 59 | Temp 97.7°F | Wt 169.0 lb

## 2010-11-04 DIAGNOSIS — Z0000 Encounter for general adult medical examination without abnormal findings: Secondary | ICD-10-CM

## 2010-11-04 DIAGNOSIS — M7989 Other specified soft tissue disorders: Secondary | ICD-10-CM

## 2010-11-04 LAB — URINALYSIS, ROUTINE W REFLEX MICROSCOPIC
Hgb urine dipstick: NEGATIVE
Ketones, ur: NEGATIVE
Specific Gravity, Urine: 1.015 (ref 1.000–1.030)
Total Protein, Urine: NEGATIVE
Urine Glucose: NEGATIVE

## 2010-11-04 LAB — CBC WITH DIFFERENTIAL/PLATELET
Basophils Absolute: 0 10*3/uL (ref 0.0–0.1)
Lymphocytes Relative: 43.8 % (ref 12.0–46.0)
Monocytes Relative: 10.2 % (ref 3.0–12.0)
Platelets: 244 10*3/uL (ref 150.0–400.0)
RDW: 14.9 % — ABNORMAL HIGH (ref 11.5–14.6)
WBC: 5.9 10*3/uL (ref 4.5–10.5)

## 2010-11-04 LAB — BASIC METABOLIC PANEL
BUN: 15 mg/dL (ref 6–23)
Calcium: 9.5 mg/dL (ref 8.4–10.5)
Creatinine, Ser: 1.1 mg/dL (ref 0.4–1.2)
GFR: 70.88 mL/min (ref >60.00)
Glucose, Bld: 89 mg/dL (ref 70–99)

## 2010-11-04 LAB — TSH: TSH: 1.89 u[IU]/mL (ref 0.35–5.50)

## 2010-11-04 LAB — LIPID PANEL
Cholesterol: 123 mg/dL (ref 0–200)
HDL: 74.9 mg/dL (ref >39.00)
Triglycerides: 62 mg/dL (ref 0.0–149.0)
VLDL: 12.4 mg/dL (ref 0.0–40.0)

## 2010-11-04 LAB — HEPATIC FUNCTION PANEL
AST: 25 U/L (ref 0–37)
Albumin: 3.8 g/dL (ref 3.5–5.2)

## 2010-11-04 NOTE — Discharge Summary (Signed)
NAMERAVYN, NIKKEL              ACCOUNT NO.:  [id number]   MEDICAL RECORD NO.:  [id number]          PATIENT TYPE:  INP   LOCATION:  0374                         FACILITY:  [facility]   PHYSICIAN:  Corwin Levins, M.D. LHCDATE OF BIRTH:  1961/05/22   DATE OF ADMISSION:  06/29/2004  DATE OF DISCHARGE:  07/01/2004                                 DISCHARGE SUMMARY   DISCHARGE DIAGNOSES:  1.  Infectious disease, questionable acute gastroenteritis, versus      bronchitis.  2.  Hypertension.  3.  Nausea and vomiting with dehydration.   PROCEDURE:  None.   CONSULTS:  None.   HISTORY AND PHYSICAL:  See that dictated by Dr. Posey Rea.   HOSPITAL COURSE:  Ms. Donofrio is a very nice 50 year old black female who had  been sick for several days with a cold-like illness for about a week,  developed worsening prostration, cough, fever, nausea, vomiting, and  dehydration requiring admission.  She responded promptly to IV fluids and IV  antibiotics, initially on Zithromax IV then Cipro p.o.  An initial white  blood cell count 24,000 down to 17.1 date of discharge.  Electrolytes within  normal limits.  BUN 7 and creatinine 1.0, glucose within normal limits.  Hemoglobin stable 10.5 to 10.2 at the time of discharge.  As she had  regained her strength, temperature had resolved except for left over  shoulder strain and cough, white blood cell count markedly and she was  afebrile, eating well; she was felt to have gained maximal benefit from this  hospitalization; and will be discharged to home.   DISPOSITION:  Discharged to home in good condition.  There are __________ or  restrictions.   DISCHARGE MEDICATIONS:  1.  Lotrel as before.  2.  __________  10 mg p.r.n.  3.  BCPs.  4.  Cipro 500 mg p.o. b.i.d. for 8 days.  5.  Vicodin q.i.d. p.r.n.  6.  Tussionex p.r.n. use.   FOLLOWUP:  She is to followup with Dr. Posey Rea in 1 week.    JWJ/MEDQ  D:  07/01/2004  T:  07/01/2004  Job:  [id number]   cc:    Georgina Quint. Plotnikov, M.D. [facility]

## 2010-11-04 NOTE — H&P (Signed)
Tina Rivera, Tina Rivera              ACCOUNT NO.:  [id number]   MEDICAL RECORD NO.:  [id number]          PATIENT TYPE:  INP   LOCATION:  0359                         FACILITY:  [facility]   PHYSICIAN:  Georgina Quint. Plotnikov, M.D. Romualdo Bolk OF BIRTH:  01-Jun-1961   DATE OF ADMISSION:  07/14/2004  DATE OF DISCHARGE:                                HISTORY & PHYSICAL   CHIEF COMPLAINT:  Nausea, vomiting, diarrhea, abdominal pain.   HISTORY OF PRESENT ILLNESS:  The patient is a 50 year old female who was  discharged from the hospital where she was treated for acute pneumonia more  than a week ago. Was doing very well up until yesterday when she developed  nausea, vomiting and diarrhea, did not sleep last night because of severe  cramping abdominal pain, was not able to keep anything down. Now presented  to the office looking quite sick. She is doubled over with pain. She makes  frequent trips to bathroom to have a stool and also throwing up in the  plastic bag bucket.  Past Medical History, Family History, Social History as per my previous  History and Physical. She is status post recent pneumonia. She has finished  antibiotic several days ago.   ALLERGIES:  CODEINE.   REVIEW OF SYSTEMS:  As above, was doing fine prior to this acute illness.   PHYSICAL EXAMINATION:  She appears in mild to moderate acute distress. She  is doubled over with pain.  VITAL SIGNS:  Stable with tachycardia. However, I do not have them in front  of me right now.  HEENT:  Dry oral mucosa.  NECK:  Supple, no meningeal signs.  LUNGS:  Clear, no wheezes or rales.  HEART:  S1, S2, no murmur, no gallop.  ABDOMEN:  Soft, nondistended. Tender throughout, no masses felt, no rebound  symptoms.  RECTAL:  Not done.  LOWER EXTREMITIES:  Without edema, calves non tender.  NEUROLOGICAL:  She is alert and oriented, cooperative.   ASSESSMENT AND PLAN:  1.  Severe abdominal cramps with diarrhea, nausea and vomiting likely due to     acute gastroenteritis. Will need to consider the possibility of C. Diff      colitis as well. Obtain stool for C. Diff, admit for 24 hour      observation, Lomotil for diarrhea, Phenergan for nausea and vomiting.      Empiric Flagyl p.o.  2.  Dehydration due to the above. IV fluids.  3.  Recent pneumonia, status post antibiotic therapy.      AVP/MEDQ  D:  07/14/2004  T:  07/14/2004  Job:  [id number]

## 2010-11-04 NOTE — H&P (Signed)
NAMEBRENDY, Tina Rivera              ACCOUNT NO.:  [id number]   MEDICAL RECORD NO.:  [id number]          PATIENT TYPE:  INP   LOCATION:  0102                         FACILITY:  [facility]   PHYSICIAN:  Georgina Quint. Plotnikov, M.D. LHCDATE OF BIRTH:  Oct 24, 1960   DATE OF ADMISSION:  08/10/2004  DATE OF DISCHARGE:                                HISTORY & PHYSICAL   CHIEF COMPLAINT:  Abdominal pain, nausea and vomiting.   HISTORY OF PRESENT ILLNESS:  The patient is a 50 year old female who was  hospitalized for C. difficile colitis in the end of January, did well with  vancomycin, has been symptom-free for about three weeks.  In the past two  days developed abdominal pain, chills, diarrhea with multiple stools (12  stools yesterday), nausea and vomiting, inability to keep anything down.   ALLERGIES:  CODEINE, FLAGYL (nausea).   Past medical history, family history, social history as per July 14, 2004  history and physical.   REVIEW OF SYSTEMS:  Prior to the past two days was doing well.  She passed  out twice in the past two days.  No head injury.   PHYSICAL EXAMINATION:  VITAL SIGNS:  Blood pressure 90/50, pulse 104,  temperature 99.6, weight 152 pounds.  GENERAL:  She appears in mild to moderate acute distress, looks tired.  HEENT:  Dryish mucosa.  NECK:  Supple, no meningeal signs.  CHEST:  Lungs clear, no wheeze or rales.  CARDIAC:  S1, S2, tachycardic.  ABDOMEN:  Diffusely tender, no rebound symptoms, no masses.  EXTREMITIES:  Lower extremities without edema.  RECTAL:  Not done.  SKIN:  Clear.   ASSESSMENT AND PLAN:  1.  Recurrent gastrointestinal illness with severe abdominal pain, nausea      and vomiting.  Possible recurrence of Clostridium difficile colitis.      Obtain stool tests, restart vancomycin, GI consultation called.  2.  Syncope, likely vasovagal.  Will hydrate aggressively.  3.  Dehydration.  Treat with IV fluids.  4.  Nausea and vomiting.  Will use Phenergan.  5.  Abdominal pain.  Workup per GI.  Morphine p.r.n.    AVP/MEDQ  D:  08/10/2004  T:  08/10/2004  Job:  [id number]

## 2010-11-04 NOTE — H&P (Signed)
NAMEKORI, GOINS              ACCOUNT NO.:  [id number]   MEDICAL RECORD NO.:  [id number]           PATIENT TYPE:   LOCATION:                                 FACILITY:   PHYSICIAN:  Georgina Quint. Plotnikov, M.D. LHCDATE OF BIRTH:  [birth year]   DATE OF ADMISSION:  06/29/2004  DATE OF DISCHARGE:                                HISTORY & PHYSICAL   CHIEF COMPLAINT:  Weakness, nausea, vomiting.   HISTORY OF PRESENT ILLNESS:  The patient is a 50 year old who has been sick  with a cold-like illness for about a week.  She got acutely ill tonight with  an elevated temperature of 102, chills, fever, aches.  Vomited 4 times.  Unable to keep anything down.  Started to cough.  She was in the emergency  room, where she was found to have a high white count, high fever, and sent  to [facility] for direct admission.   PAST MEDICAL HISTORY:  1.  Hypertension.  2.  Rhinitis.   ALLERGIES:  CODEINE.   SOCIAL HISTORY:  She does not smoke or drink.   CURRENT MEDICATIONS:  1.  Lotrel.  2.  Zyrtec.  3.  Birth control pills.   REVIEW OF SYSTEMS:  Last menstrual period June 22, 2004.  No chest pain,  abdominal aches, no diarrhea.  The rest is as above.   PHYSICAL EXAMINATION:  GENERAL:  She appears acutely sick.  VITAL SIGNS:  Temperature 102.3, heart rate 135, respirations 42, blood  pressure 138/84.  HEENT:  Dry oral mucosa.  NECK:  Supple.  No meningeal signs.  LUNGS:  Decreased breath sounds bilaterally.  No wheezes or rales.  HEART:  S1 and S2.  Tachycardic.  ABDOMEN:  Soft, nontender, nondistended.  Sensitive throughout.  EXTREMITIES:  Lower extremities without edema.  Calves are nontender.  NEUROLOGIC:  She is alerted, oriented, and cooperative.  SKIN:  Clear without rashes.   LABORATORY DATA:  Chest x-ray without obvious infiltrate.  White count 17.5.  EKG with sinus tachycardia.   ASSESSMENT AND PLAN:  1.  Nausea, vomiting, and dehydration.  Start with IV fluids.  Obtain blood      chemistry.  2.  Possible community acquired pneumonia.  IV Zithromax.  Will give another      gram of Rocephin.  Albuterol      hand-held nebulization q.4h.  3.  Hypertension.  Will hold therapy at present.  4.  Nausea and vomiting.  Will use Phenergan.       ___________________________________________  Georgina Quint. Plotnikov, M.D. [facility]    AVP/MEDQ  D:  06/29/2004  T:  06/29/2004  Job:  [id number]

## 2010-11-04 NOTE — Discharge Summary (Signed)
Tina Rivera, Tina Rivera NO.:  [id number]   MEDICAL RECORD NO.:  [id number]          PATIENT TYPE:  INP   LOCATION:  0474                         FACILITY:  [facility]   PHYSICIAN:  Wanda Plump, MD [facility]    DATE OF BIRTH:  1960-08-30   DATE OF ADMISSION:  08/10/2004  DATE OF DISCHARGE:  08/13/2004                                 DISCHARGE SUMMARY   PRIMARY CARE DOCTOR:  Georgina Quint. Plotnikov, M.D. [facility].   ADMITTING DIAGNOSES:  1.  Recurrent colitis, possible Clostridium difficile colitis.  2.  Syncope likely vasovagal.  3.  Dehydration.  4.  Abdominal pain.   DISCHARGE DIAGNOSIS:  Recurrent Clostridium difficile colitis.   BRIEF HISTORY AND PHYSICAL:  Tina Rivera is a 50 year old female with a  history of hypertension who was admitted with a two-day history of abdominal  pain, chills, diarrhea, and multiple stools.  She was recently hospitalized  for C. diff colitis and was prescribed vancomycin p.o. since she could not  tolerate Flagyl.  On physical exam her blood pressure was 90/50 with a pulse  of 104, temperature 99.6.  The abdomen showed diffuse tenderness with no  rebound or masses.   LABORATORY AND X-RAYS:  Initial CBC showed a white count of 10.1 with a  hemoglobin of 11.7, platelets of 254.  Potassium was 3.7, creatinine was  1.6, blood sugar 93, sodium 136.  Repeat BMP showed a creatinine of 1.0.  AST, ALT, alkaline phosphatase were normal.  Amylase was slightly elevated  to 142.  Lipase was normal.  Stools were sent to the lab and one of the  stools came back positive for C. diff.  Sed rate was normal at 16.  A CT of  the abdomen and pelvis showed mild colonic wall thickening which is  nonspecific and leiomyomatous involvement of the uterus.   HOSPITAL COURSE:  1.  The patient was admitted to the floor and she was started with      aggressive IV hydration as well as vancomycin by mouth.  GI was involved      __________  .  By the time of discharge, she was  feeling better.  Her      appetite was very good and she also had good tolerance to p.o.  The      diarrhea stopped the day before the discharge home.  On exam, she still      had some left lower quadrant tenderness without any rebound.  At this      point, it was felt that she had reached maximum hospital benefit, and      she will be discharged home on vancomycin taper dose for seven weeks.      She will also be discharged on Questran, Phenergan p.r.n.  2.  Her blood pressure medication was held.  We will send her home with      instructions to restart the blood pressure medication in two days.  3.  She was instructed to continue with birth control pills but to use      additional birth control method such  as a condom since she has skipped      doses.   DISCHARGE INSTRUCTIONS:  1.  Vancomycin 125 mg one p.o. q.i.d. for two weeks, one p.o. t.i.d. for one      week, one p.o. b.i.d. for one week, one p.o. every day for one week, and      one p.o. every other day for one week, and finally one p.o. every third      day for one week, a total of 97 pills were prescribed.  2.  Hold blood pressure medication for two days.  3.  Reassume birth control pills but use an additional method for the next      month.  4.  Questran p.r.n. diarrhea.  5.  Phenergan p.r.n. for nausea.  6.  Full liquid diet __________  and then advance as tolerated.  7.  See her PCP next week for a checkup.  8.  Come back to the hospital if her symptoms increase.      JEP/MEDQ  D:  08/13/2004  T:  08/13/2004  Job:  [id number]

## 2010-11-04 NOTE — Discharge Summary (Signed)
Tina Rivera, Tina Rivera              ACCOUNT NO.:  [id number]   MEDICAL RECORD NO.:  [id number]          PATIENT TYPE:  INP   LOCATION:  0359                         [facility]ACILITY:  [facility]   PHYSICIAN:  Rene Paci, M.D. LHCDATE OF BIRTH:  Dec 13, 1960   DATE OF ADMISSION:  07/14/2004  DATE OF DISCHARGE:  07/20/2004                                 DISCHARGE SUMMARY   DISCHARGE DIAGNOSES:  1.  Acute Clostridium difficile colitis with abdominal pain and diarrhea,      improved on oral vancomycin tolerating by mouth diet and stable for      discharge home.  2.  Abdominal pain secondary to above, no evidence for acute pancreatitis.      CT abdomen/pelvis and ultrasound normal.  Amylase/lipase normalized,      abnormality felt secondary to #1.  3.  Recent community-acquired pneumonia status post antibiotic therapy.  4.  Early dehydration secondary to #1, resolved.   DISCHARGE MEDICATIONS:  1.  Vancomycin 125 mg tablets one p.o. q.i.d. x12 additional days to      complete 2-week course previously on IV Flagyl and p.o. Flagyl but      intolerant secondary to nausea.  2.  Bentyl 10 mg one t.i.d. while on antibiotics p.r.n. abdominal cramps.   Otherwise, medications are as prior to admission.   DISPOSITION:  The patient is discharged home tolerating regular diet.  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  Hospital followup will be with  primary care physician Dr. Trinna Post Plotnikov.  Patient is to call for  appointment in the next 1-2 weeks.   HOSPITAL COURSE BY PROBLEM:  Problem 1. ABDOMINAL PAIN WITH DIARRHEA.  Patient is a 50 year old woman recently treated for community-acquired  pneumonia who returned to her primary care physician with increasing  abdominal pain and diarrhea.  Because of the severity of this as well as  concerns for dehydration she was readmitted to the hospital, a culture was  sent and found  to be positive for Clostridium difficile toxin and she was  begun on p.o. Flagyl however, she was intolerant of this with increasing  nausea and abdominal pain.  Amylase/lipase were checked and there was  concern for pancreatitis exacerbating and complicating her Clostridium  difficile however, imaging studies failed to confirm pancreas inflammation,  these enzymes returned to normal and patient was changed to p.o. vancomycin  which she seems to have tolerated better.  Her diarrhea has improved and the  abdominal pain has also resolved initially with morphine and then with  fentanyl.  At this time she is tolerating a regular diet, her p.o.  vancomycin and felt stable for discharge home.      VL/MEDQ  D:  07/20/2004  T:  07/20/2004  Job:  [id number]

## 2010-11-07 ENCOUNTER — Ambulatory Visit: Payer: BC Managed Care – PPO | Admitting: Internal Medicine

## 2010-11-07 NOTE — Progress Notes (Signed)
  Subjective:    Patient ID: Tina Rivera, female    DOB: October 02, 1960, 50 y.o.   MRN: [id number]  HPI Patient presents for perceived swelling of the left leg. This started several days ago and is now better. She has not had any pain, no SOB, no anxiety. She has not had prolonged immobility. She denies any calve pain or tenderness.  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  HENT:  Negative for hearing loss, ear pain, congestion, neck stiffness and postnasal drip.   Eyes: Negative for pain, discharge and visual disturbance.  Respiratory: Negative for chest tightness and wheezing.   Cardiovascular: Negative for chest pain and palpitations.       [No decreased exercise tolerance Gastrointestinal: [No change in bowel habit. No bloating or gas. No reflux or indigestion Genitourinary: Negative for urgency, frequency, flank pain and difficulty urinating.  Musculoskeletal: Negative for myalgias, back pain, arthralgias and gait problem.  Neurological: Negative for dizziness, tremors, weakness and headaches.  Hematological: Negative for adenopathy.  Psychiatric/Behavioral: Negative for behavioral problems and dysphoric mood.       Objective:   Physical Exam Vitals noted. WNWD white woman in no distress Pul - CTAP Cor - RRR Extremity - no noticeable swelling left leg, no calve  tenderness, negative homan's sign       Assessment & Plan:  1. Leg pain - no abnormality on exam.

## 2010-11-17 ENCOUNTER — Telehealth: Payer: Self-pay | Admitting: *Deleted

## 2010-11-17 NOTE — Telephone Encounter (Signed)
Pt request Rx for "Sinus Migraine" or advise as to what she can do.?

## 2010-11-17 NOTE — Telephone Encounter (Signed)
Try Advil 200 mg 2 bid prn Thx

## 2010-11-18 NOTE — Telephone Encounter (Signed)
LMOM to inform Pt. And advised to call & schedule OV if symptoms do not improve.

## 2010-12-12 ENCOUNTER — Encounter: Payer: Self-pay | Admitting: Internal Medicine

## 2010-12-12 ENCOUNTER — Ambulatory Visit (INDEPENDENT_AMBULATORY_CARE_PROVIDER_SITE_OTHER): Payer: BC Managed Care – PPO | Admitting: Internal Medicine

## 2010-12-12 DIAGNOSIS — D509 Iron deficiency anemia, unspecified: Secondary | ICD-10-CM

## 2010-12-12 DIAGNOSIS — R12 Heartburn: Secondary | ICD-10-CM | POA: Insufficient documentation

## 2010-12-12 DIAGNOSIS — I10 Essential (primary) hypertension: Secondary | ICD-10-CM

## 2010-12-12 DIAGNOSIS — L659 Nonscarring hair loss, unspecified: Secondary | ICD-10-CM | POA: Insufficient documentation

## 2010-12-12 MED ORDER — SYNTHROID 75 MCG PO TABS: 75.0000 ug | ORAL_TABLET | Freq: Every day | ORAL | Status: DC

## 2010-12-12 NOTE — Patient Instructions (Addendum)
You can try Rogaine for women Nexium 1 a day

## 2010-12-12 NOTE — Assessment & Plan Note (Signed)
On Rx 

## 2010-12-12 NOTE — Progress Notes (Signed)
  Subjective:    Patient ID: Tina Rivera, female    DOB: 1960/10/01, 50 y.o.   MRN: [id number]  HPI   The patient presents for a follow-up of  chronic hypertension, chronic hypothyroidism controlled with medicines. C/ hair loss, chronic. C/o lesion on R forearm.   Review of Systems  Constitutional: Negative for chills, activity change, appetite change, fatigue and unexpected weight change.  HENT: Negative for congestion, mouth sores and sinus pressure.   Eyes: Negative for visual disturbance.  Respiratory: Negative for cough and chest tightness.   Gastrointestinal: Negative for nausea, vomiting, abdominal pain and abdominal distention.       Heartburn  Genitourinary: Negative for frequency, difficulty urinating and vaginal pain.  Musculoskeletal: Negative for back pain and gait problem.  Skin: Negative for pallor, rash and wound.  Neurological: Negative for dizziness, tremors, weakness, numbness and headaches.  Psychiatric/Behavioral: Negative for confusion and sleep disturbance.       Objective:   Physical Exam  Constitutional: She appears well-developed and well-nourished. No distress.  HENT:  Head: Normocephalic.  Right Ear: External ear normal.  Left Ear: External ear normal.  Nose: Nose normal.  Mouth/Throat: Oropharynx is clear and moist.  Eyes: Conjunctivae are normal. Pupils are equal, round, and reactive to light. Right eye exhibits no discharge. Left eye exhibits no discharge.  Neck: Normal range of motion. Neck supple. No JVD present. No tracheal deviation present. No thyromegaly present.  Cardiovascular: Normal rate, regular rhythm and normal heart sounds.   Pulmonary/Chest: No stridor. No respiratory distress. She has no wheezes.  Abdominal: Soft. Bowel sounds are normal. She exhibits no distension and no mass. There is no tenderness. There is no rebound and no guarding.  Musculoskeletal: She exhibits no edema and no tenderness.  Lymphadenopathy:    She has no  cervical adenopathy.  Neurological: She displays normal reflexes. No cranial nerve deficit. She exhibits normal muscle tone. Coordination normal.  Skin: No rash noted. No erythema.       Black head 1 mm on R forearm  Psychiatric: She has a normal mood and affect. Her behavior is normal. Judgment and thought content normal.       Lab Results  Component Value Date   WBC 5.9 11/04/2010   HGB 11.4* 11/04/2010   HCT 33.4* 11/04/2010   PLT 244.0 11/04/2010   CHOL 123 11/04/2010   TRIG 62.0 11/04/2010   HDL 74.90 11/04/2010   ALT 25 11/04/2010   AST 25 11/04/2010   NA 140 11/04/2010   K 3.9 11/04/2010   CL 101 11/04/2010   CREATININE 1.1 11/04/2010   BUN 15 11/04/2010   CO2 32 11/04/2010   TSH 1.89 11/04/2010      Assessment & Plan:    I tried to remove black head unsuccessfully under sterile conditions. I feel it needs a good hot compress prior to removal. Complications - none.

## 2010-12-12 NOTE — Assessment & Plan Note (Signed)
Try Ranitidine

## 2010-12-12 NOTE — Assessment & Plan Note (Signed)
Check B12, iron, Vit D

## 2011-02-06 ENCOUNTER — Other Ambulatory Visit: Payer: Self-pay | Admitting: Internal Medicine

## 2011-03-24 LAB — DIFFERENTIAL
Basophils Absolute: 0 10*3/uL (ref 0.0–0.1)
Basophils Relative: 0 % (ref 0–1)
Eosinophils Absolute: 0 10*3/uL (ref 0.0–0.7)
Neutrophils Relative %: 92 % — ABNORMAL HIGH (ref 43–77)

## 2011-03-24 LAB — BASIC METABOLIC PANEL
BUN: 14 mg/dL (ref 6–23)
Creatinine, Ser: 1.09 mg/dL (ref 0.4–1.2)
GFR calc Af Amer: >60 mL/min (ref >60)
Potassium: 3.2 mEq/L — ABNORMAL LOW (ref 3.5–5.1)
Sodium: 137 mEq/L (ref 135–145)

## 2011-03-24 LAB — CBC
MCHC: 32.8 g/dL (ref 30.0–36.0)
MCV: 82.9 fL (ref 78.0–100.0)
Platelets: 230 10*3/uL (ref 150–400)
RDW: 15.6 % — ABNORMAL HIGH (ref 11.5–15.5)

## 2011-03-31 ENCOUNTER — Ambulatory Visit: Payer: BC Managed Care – PPO | Admitting: Internal Medicine

## 2011-08-07 ENCOUNTER — Other Ambulatory Visit: Payer: Self-pay | Admitting: Internal Medicine

## 2011-08-10 ENCOUNTER — Telehealth: Payer: Self-pay | Admitting: Internal Medicine

## 2011-08-10 MED ORDER — OLMESARTAN-AMLODIPINE-HCTZ 40-10-25 MG PO TABS: 1.0000 | ORAL_TABLET | Freq: Every day | ORAL | Status: DC

## 2011-08-10 NOTE — Telephone Encounter (Signed)
bp meds - done

## 2011-11-11 ENCOUNTER — Other Ambulatory Visit: Payer: Self-pay | Admitting: Internal Medicine

## 2012-01-01 ENCOUNTER — Other Ambulatory Visit: Payer: Self-pay | Admitting: Internal Medicine

## 2012-02-05 ENCOUNTER — Encounter: Payer: Self-pay | Admitting: Internal Medicine

## 2012-02-05 ENCOUNTER — Ambulatory Visit (INDEPENDENT_AMBULATORY_CARE_PROVIDER_SITE_OTHER): Payer: BC Managed Care – PPO | Admitting: Internal Medicine

## 2012-02-05 ENCOUNTER — Other Ambulatory Visit (INDEPENDENT_AMBULATORY_CARE_PROVIDER_SITE_OTHER): Payer: BC Managed Care – PPO

## 2012-02-05 VITALS — BP 108/60 | HR 80 | Temp 97.6°F | Resp 16 | Wt 180.0 lb

## 2012-02-05 DIAGNOSIS — I10 Essential (primary) hypertension: Secondary | ICD-10-CM

## 2012-02-05 DIAGNOSIS — D649 Anemia, unspecified: Secondary | ICD-10-CM

## 2012-02-05 DIAGNOSIS — Z23 Encounter for immunization: Secondary | ICD-10-CM

## 2012-02-05 DIAGNOSIS — Z0000 Encounter for general adult medical examination without abnormal findings: Secondary | ICD-10-CM

## 2012-02-05 DIAGNOSIS — Z1211 Encounter for screening for malignant neoplasm of colon: Secondary | ICD-10-CM

## 2012-02-05 LAB — CBC WITH DIFFERENTIAL/PLATELET
Basophils Relative: 0.3 % (ref 0.0–3.0)
Eosinophils Absolute: 0.1 10*3/uL (ref 0.0–0.7)
Eosinophils Relative: 1.3 % (ref 0.0–5.0)
Hemoglobin: 11.6 g/dL — ABNORMAL LOW (ref 12.0–15.0)
Lymphocytes Relative: 38.4 % (ref 12.0–46.0)
MCHC: 32.5 g/dL (ref 30.0–36.0)
Monocytes Relative: 11 % (ref 3.0–12.0)
Neutro Abs: 3.1 10*3/uL (ref 1.4–7.7)
Neutrophils Relative %: 49 % (ref 43.0–77.0)
RBC: 4.31 Mil/uL (ref 3.87–5.11)
WBC: 6.4 10*3/uL (ref 4.5–10.5)

## 2012-02-05 LAB — HEPATIC FUNCTION PANEL
ALT: 14 U/L (ref 0–35)
AST: 23 U/L (ref 0–37)
Albumin: 4.2 g/dL (ref 3.5–5.2)
Alkaline Phosphatase: 92 U/L (ref 39–117)
Total Protein: 7.6 g/dL (ref 6.0–8.3)

## 2012-02-05 LAB — URINALYSIS
Bilirubin Urine: NEGATIVE
Ketones, ur: NEGATIVE
Leukocytes, UA: NEGATIVE
Specific Gravity, Urine: 1.015 (ref 1.000–1.030)
Urine Glucose: NEGATIVE
Urobilinogen, UA: 0.2 (ref 0.0–1.0)

## 2012-02-05 LAB — LIPID PANEL
Cholesterol: 136 mg/dL (ref 0–200)
HDL: 80.7 mg/dL (ref >39.00)
Total CHOL/HDL Ratio: 2
Triglycerides: 63 mg/dL (ref 0.0–149.0)

## 2012-02-05 LAB — IBC PANEL
Saturation Ratios: 27.5 % (ref 20.0–50.0)
Transferrin: 220.6 mg/dL (ref 212.0–360.0)

## 2012-02-05 LAB — BASIC METABOLIC PANEL
BUN: 17 mg/dL (ref 6–23)
Creatinine, Ser: 1.1 mg/dL (ref 0.4–1.2)
GFR: 70.52 mL/min (ref >60.00)
Glucose, Bld: 93 mg/dL (ref 70–99)
Potassium: 3.3 mEq/L — ABNORMAL LOW (ref 3.5–5.1)

## 2012-02-05 LAB — VITAMIN B12: Vitamin B-12: 807 pg/mL (ref 211–911)

## 2012-02-05 LAB — TSH: TSH: 0.53 u[IU]/mL (ref 0.35–5.50)

## 2012-02-05 NOTE — Progress Notes (Signed)
  Subjective:    Patient ID: Tina Rivera, female    DOB: 07-04-1960, 51 y.o.   MRN: [id number]  HPI  The patient is here for a wellness exam. The patient has been doing well overall without major physical or psychological issues going on lately. C/o L breast pain x 2 years off and on The patient presents for a follow-up of  chronic hypertension, chronic hypothyroidism controlled with medicines. C/ hair loss, chronic. C/o lesions on neck  BP Readings from Last 3 Encounters:  02/05/12 108/60  12/12/10 108/80  11/04/10 118/84   Wt Readings from Last 3 Encounters:  02/05/12 180 lb (81.647 kg)  12/12/10 172 lb (78.019 kg)  11/04/10 169 lb (76.658 kg)      Review of Systems  Constitutional: Negative for chills, activity change, appetite change, fatigue and unexpected weight change.  HENT: Negative for congestion, mouth sores and sinus pressure.   Eyes: Negative for visual disturbance.  Respiratory: Negative for cough and chest tightness.   Gastrointestinal: Negative for nausea, vomiting, abdominal pain and abdominal distention.       Heartburn  Genitourinary: Negative for frequency, difficulty urinating and vaginal pain.  Musculoskeletal: Negative for back pain and gait problem.  Skin: Negative for pallor, rash and wound.  Neurological: Negative for dizziness, tremors, weakness, numbness and headaches.  Psychiatric/Behavioral: Negative for confusion and disturbed wake/sleep cycle.       Objective:   Physical Exam  Constitutional: She appears well-developed and well-nourished. No distress.  HENT:  Head: Normocephalic.  Right Ear: External ear normal.  Left Ear: External ear normal.  Nose: Nose normal.  Mouth/Throat: Oropharynx is clear and moist.  Eyes: Conjunctivae are normal. Pupils are equal, round, and reactive to light. Right eye exhibits no discharge. Left eye exhibits no discharge.  Neck: Normal range of motion. Neck supple. No JVD present. No tracheal deviation  present. No thyromegaly present.  Cardiovascular: Normal rate, regular rhythm and normal heart sounds.   Pulmonary/Chest: No stridor. No respiratory distress. She has no wheezes.  Abdominal: Soft. Bowel sounds are normal. She exhibits no distension and no mass. There is no tenderness. There is no rebound and no guarding.  Musculoskeletal: She exhibits no edema and no tenderness.  Lymphadenopathy:    She has no cervical adenopathy.  Neurological: She displays normal reflexes. No cranial nerve deficit. She exhibits normal muscle tone. Coordination normal.  Skin: No rash noted. No erythema.  Psychiatric: She has a normal mood and affect. Her behavior is normal. Judgment and thought content normal.       Lab Results  Component Value Date   WBC 5.9 11/04/2010   HGB 11.4* 11/04/2010   HCT 33.4* 11/04/2010   PLT 244.0 11/04/2010   CHOL 123 11/04/2010   TRIG 62.0 11/04/2010   HDL 74.90 11/04/2010   ALT 25 11/04/2010   AST 25 11/04/2010   NA 140 11/04/2010   K 3.9 11/04/2010   CL 101 11/04/2010   CREATININE 1.1 11/04/2010   BUN 15 11/04/2010   CO2 32 11/04/2010   TSH 1.89 11/04/2010      Assessment & Plan:

## 2012-02-05 NOTE — Assessment & Plan Note (Signed)
Continue with current prescription therapy as reflected on the Med list.  

## 2012-02-05 NOTE — Assessment & Plan Note (Signed)
We discussed age appropriate health related issues, including available/recomended screening tests and vaccinations. We discussed a need for adhering to healthy diet and exercise. Labs/EKG were reviewed/ordered. All questions were answered.   

## 2012-02-06 ENCOUNTER — Telehealth: Payer: Self-pay | Admitting: Internal Medicine

## 2012-02-06 NOTE — Telephone Encounter (Signed)
Tina Rivera, please, inform patient that all labs are normal except for low potassium Re-start KCl if not taking or double (20 meq bid) if taking 1 a day now. BMET in 2 mo Thx

## 2012-02-06 NOTE — Telephone Encounter (Signed)
Left mess for patient to call back.  

## 2012-02-08 NOTE — Telephone Encounter (Signed)
Called pt again still no answer LMOM RTC ASAP concerning labs...02/08/12@11 :26am/LMB

## 2012-02-09 NOTE — Telephone Encounter (Signed)
Left mess for patient to call back.  

## 2012-02-12 NOTE — Telephone Encounter (Signed)
Pt advised of result per AVP and follow up labs

## 2012-02-21 ENCOUNTER — Ambulatory Visit: Payer: BC Managed Care – PPO | Admitting: Internal Medicine

## 2012-03-05 ENCOUNTER — Encounter: Payer: Self-pay | Admitting: Gastroenterology

## 2012-03-25 ENCOUNTER — Ambulatory Visit (AMBULATORY_SURGERY_CENTER): Payer: BC Managed Care – PPO | Admitting: *Deleted

## 2012-03-25 ENCOUNTER — Encounter: Payer: Self-pay | Admitting: Gastroenterology

## 2012-03-25 VITALS — Ht 63.5 in | Wt 176.5 lb

## 2012-03-25 DIAGNOSIS — Z1211 Encounter for screening for malignant neoplasm of colon: Secondary | ICD-10-CM

## 2012-03-25 MED ORDER — SUPREP BOWEL PREP KIT 17.5-3.13-1.6 GM/177ML PO SOLN: ORAL | Status: DC

## 2012-04-05 ENCOUNTER — Encounter: Payer: Self-pay | Admitting: Gastroenterology

## 2012-04-05 ENCOUNTER — Ambulatory Visit (AMBULATORY_SURGERY_CENTER): Payer: BC Managed Care – PPO | Admitting: Gastroenterology

## 2012-04-05 VITALS — BP 118/75 | HR 53 | Temp 98.4°F | Resp 22 | Ht 63.5 in | Wt 176.0 lb

## 2012-04-05 DIAGNOSIS — Z1211 Encounter for screening for malignant neoplasm of colon: Secondary | ICD-10-CM

## 2012-04-05 MED ORDER — SODIUM CHLORIDE 0.9 % IV SOLN: 500.0000 mL | INTRAVENOUS | Status: DC

## 2012-04-05 NOTE — Op Note (Signed)
[facility] [address] [place], [address]   COLONOSCOPY PROCEDURE REPORT  PATIENT: Tina, Rivera  MR#: [id number] BIRTHDATE: 1960-12-29 , 51  yrs. old GENDER: Female ENDOSCOPIST: Louis Meckel, MD REFERRED XB:JYNW Buckner Malta, M.D. PROCEDURE DATE:  04/05/2012 PROCEDURE:   Colonoscopy, diagnostic ASA CLASS:   Class II INDICATIONS:average risk screening. MEDICATIONS: MAC sedation, administered by CRNA and Propofol (Diprivan) 280 mg IV  DESCRIPTION OF PROCEDURE:   After the risks benefits and alternatives of the procedure were thoroughly explained, informed consent was obtained.  A digital rectal exam revealed no abnormalities of the rectum.   The LB CF-[id number]  endoscope was introduced through the anus and advanced to the cecum, which was identified by both the appendix and ileocecal valve. No adverse events experienced.   The quality of the prep was Suprep excellent The instrument was then slowly withdrawn as the colon was fully examined.      COLON FINDINGS: A normal appearing cecum, ileocecal valve, and appendiceal orifice were identified.  The ascending, hepatic flexure, transverse, splenic flexure, descending, sigmoid colon and rectum appeared unremarkable.  No polyps or cancers were seen. Retroflexed views revealed no abnormalities. The time to cecum=5 minutes 15 seconds.  Withdrawal time=6 minutes 28 seconds.  The scope was withdrawn and the procedure completed. COMPLICATIONS: There were no complications.  ENDOSCOPIC IMPRESSION: Normal colon  RECOMMENDATIONS: Continue current colorectal screening recommendations for "routine risk" patients with a repeat colonoscopy in 10 years.   eSigned:  Louis Meckel, MD 04/05/2012 11:04 AM   cc:

## 2012-04-05 NOTE — Progress Notes (Signed)
Patient did not experience any of the following events: a burn prior to discharge; a fall within the facility; wrong site/side/patient/procedure/implant event; or a hospital transfer or hospital admission upon discharge from the facility. (G8907) Patient did not have preoperative order for IV antibiotic SSI prophylaxis. (G8918)  

## 2012-04-05 NOTE — Patient Instructions (Addendum)
Discharge instructions given with verbal understanding. Normal exam. Resume previous medications. YOU HAD AN ENDOSCOPIC PROCEDURE TODAY AT THE [facility]: Refer to the procedure report that was given to you for any specific questions about what was found during the examination.  If the procedure report does not answer your questions, please call your gastroenterologist to clarify.  If you requested that your care partner not be given the details of your procedure findings, then the procedure report has been included in a sealed envelope for you to review at your convenience later.  YOU SHOULD EXPECT: Some feelings of bloating in the abdomen. Passage of more gas than usual.  Walking can help get rid of the air that was put into your GI tract during the procedure and reduce the bloating. If you had a lower endoscopy (such as a colonoscopy or flexible sigmoidoscopy) you may notice spotting of blood in your stool or on the toilet paper. If you underwent a bowel prep for your procedure, then you may not have a normal bowel movement for a few days.  DIET: Your first meal following the procedure should be a light meal and then it is ok to progress to your normal diet.  A half-sandwich or bowl of soup is an example of a good first meal.  Heavy or fried foods are harder to digest and may make you feel nauseous or bloated.  Likewise meals heavy in dairy and vegetables can cause extra gas to form and this can also increase the bloating.  Drink plenty of fluids but you should avoid alcoholic beverages for 24 hours.  ACTIVITY: Your care partner should take you home directly after the procedure.  You should plan to take it easy, moving slowly for the rest of the day.  You can resume normal activity the day after the procedure however you should NOT DRIVE or use heavy machinery for 24 hours (because of the sedation medicines used during the test).    SYMPTOMS TO REPORT IMMEDIATELY: A gastroenterologist  can be reached at any hour.  During normal business hours, 8:30 AM to 5:00 PM Monday through Friday, call [phone].  After hours and on weekends, please call the GI answering service at [phone] who will take a message and have the physician on call contact you.   Following lower endoscopy (colonoscopy or flexible sigmoidoscopy):  Excessive amounts of blood in the stool  Significant tenderness or worsening of abdominal pains  Swelling of the abdomen that is new, acute  Fever of 100F or higher  FOLLOW UP: If any biopsies were taken you will be contacted by phone or by letter within the next 1-3 weeks.  Call your gastroenterologist if you have not heard about the biopsies in 3 weeks.  Our staff will call the home number listed on your records the next business day following your procedure to check on you and address any questions or concerns that you may have at that time regarding the information given to you following your procedure. This is a courtesy call and so if there is no answer at the home number and we have not heard from you through the emergency physician on call, we will assume that you have returned to your regular daily activities without incident.  SIGNATURES/CONFIDENTIALITY: You and/or your care partner have signed paperwork which will be entered into your electronic medical record.  These signatures attest to the fact that that the information above on your After Visit Summary has been reviewed   and is understood.  Full responsibility of the confidentiality of this discharge information lies with you and/or your care-partner. 

## 2012-04-05 NOTE — Progress Notes (Addendum)
Propofol per L Beeson CRNA, see scanned intra procedure report.  ewm  After propofol started per CRNA, pt yelling it was burning and hurting, restless, all over the bed and ivf open and after several seconds pt asleep and not restless. ewm

## 2012-04-08 ENCOUNTER — Telehealth: Payer: Self-pay | Admitting: *Deleted

## 2012-04-08 NOTE — Telephone Encounter (Signed)
  Follow up Call-  Call back number 04/05/2012  Post procedure Call Back phone  # [phone]  Permission to leave phone message Yes     Patient questions:  Do you have a fever, pain , or abdominal swelling? no Pain Score  0 *  Have you tolerated food without any problems? yes  Have you been able to return to your normal activities? yes  Do you have any questions about your discharge instructions: Diet   no Medications  no Follow up visit  no  Do you have questions or concerns about your Care? yes  Actions: * If pain score is 4 or above: No action needed, pain <4.  Pt states that she wanted to make a comment about her IV- she states that when she had her Propofol given at first, the pain was so bad that she screamed out in pain.  She states IV site is fine; no redness or swelling noted today or over the weekend.  She states, "I just want note to be made of it."

## 2012-06-21 ENCOUNTER — Telehealth: Payer: Self-pay | Admitting: Internal Medicine

## 2012-06-21 NOTE — Telephone Encounter (Signed)
I looked in the cabinet.  Did not find any discount cards for synthroid.  Pt is aware.

## 2012-06-21 NOTE — Telephone Encounter (Signed)
Patient is requesting synthroid coupon, states this is her 3rd message

## 2012-07-15 ENCOUNTER — Telehealth: Payer: Self-pay | Admitting: *Deleted

## 2012-07-15 NOTE — Telephone Encounter (Signed)
Caller requesting Rx for zpak for pt. She c/o sinus drainage, chest congestion and prod cough x 2-3 days. She cant come in for OV.  Please advise.

## 2012-07-15 NOTE — Telephone Encounter (Signed)
Use over-the-counter  "cold" medicines  such as  "Afrin" nasal spray for nasal congestion as directed instead. Use" Delsym" or" Robitussin" cough syrup varietis for cough.  You can use plain "Tylenol" or "Advi"l for fever, chills and achyness.   "Common cold" symptoms are usually triggered by a virus.  The antibiotics are usually not necessary. On average, a" viral cold" illness would take 4-7 days to resolve. Please, make an appointment if you are not better or if you're worse.  

## 2012-07-16 NOTE — Telephone Encounter (Signed)
Left detailed mess informing pt's daughter of below. 

## 2012-08-28 ENCOUNTER — Other Ambulatory Visit: Payer: Self-pay | Admitting: Internal Medicine

## 2012-09-04 ENCOUNTER — Telehealth: Payer: Self-pay | Admitting: *Deleted

## 2012-09-04 NOTE — Telephone Encounter (Signed)
Pt's daughter left vm stating pt c/o hip pain x 2 days. She has been taking OTC Naproxen with no relieve. She is requesting Rx for Naproxen be sent to her pharmacy.

## 2012-09-05 ENCOUNTER — Ambulatory Visit: Payer: BC Managed Care – PPO | Admitting: Internal Medicine

## 2012-09-05 ENCOUNTER — Telehealth: Payer: Self-pay | Admitting: Internal Medicine

## 2012-09-05 MED ORDER — NAPROXEN 500 MG PO TABS: 500.0000 mg | ORAL_TABLET | Freq: Two times a day (BID) | ORAL | Status: DC | PRN

## 2012-09-05 NOTE — Telephone Encounter (Signed)
Ok OV if not well Thx 

## 2012-09-05 NOTE — Telephone Encounter (Signed)
Patient calling about the refill request for the Naproxen.  States that she has been calling for same since yesterday. Note found where her daughter called on Wednesday 3/19.   No note in medication list of this medication.  Advised that same can be bought OTC but she states that she has the OTC and it isn't relieving the pain for longer then 2 hours.  She then asked about Motrin, she has an order for 600 mg po q 6 hours and was advised that this too can be purchased OTC.  Offered to triage, she declined.  Offered an appointment for today and states that she only wants to see Dr. Posey Rea and he is leaving now for the day.  She said that she would try the Motrin 600 mg and if no relief would call back later for an appointment.

## 2012-09-06 NOTE — Telephone Encounter (Signed)
Left detailed mess informing pt's daughter of below. 

## 2012-09-16 ENCOUNTER — Other Ambulatory Visit: Payer: Self-pay | Admitting: Internal Medicine

## 2012-10-25 ENCOUNTER — Other Ambulatory Visit: Payer: Self-pay

## 2012-10-25 ENCOUNTER — Emergency Department (HOSPITAL_COMMUNITY)
Admission: EM | Admit: 2012-10-25 | Discharge: 2012-10-25 | Disposition: A | Discharge status: Home / Self Care | Payer: BC Managed Care – PPO | Attending: Emergency Medicine | Admitting: Emergency Medicine

## 2012-10-25 ENCOUNTER — Encounter (HOSPITAL_COMMUNITY): Payer: Self-pay | Admitting: Emergency Medicine

## 2012-10-25 DIAGNOSIS — R55 Syncope and collapse: Secondary | ICD-10-CM

## 2012-10-25 DIAGNOSIS — Z8679 Personal history of other diseases of the circulatory system: Secondary | ICD-10-CM | POA: Insufficient documentation

## 2012-10-25 DIAGNOSIS — E876 Hypokalemia: Secondary | ICD-10-CM

## 2012-10-25 DIAGNOSIS — Z79899 Other long term (current) drug therapy: Secondary | ICD-10-CM | POA: Insufficient documentation

## 2012-10-25 DIAGNOSIS — E0590 Thyrotoxicosis, unspecified without thyrotoxic crisis or storm: Secondary | ICD-10-CM | POA: Insufficient documentation

## 2012-10-25 DIAGNOSIS — Z8742 Personal history of other diseases of the female genital tract: Secondary | ICD-10-CM | POA: Insufficient documentation

## 2012-10-25 DIAGNOSIS — I10 Essential (primary) hypertension: Secondary | ICD-10-CM | POA: Insufficient documentation

## 2012-10-25 DIAGNOSIS — Z8739 Personal history of other diseases of the musculoskeletal system and connective tissue: Secondary | ICD-10-CM | POA: Insufficient documentation

## 2012-10-25 DIAGNOSIS — R51 Headache: Secondary | ICD-10-CM | POA: Insufficient documentation

## 2012-10-25 DIAGNOSIS — D649 Anemia, unspecified: Secondary | ICD-10-CM

## 2012-10-25 LAB — CBC
HCT: 32.2 % — ABNORMAL LOW (ref 36.0–46.0)
Hemoglobin: 10.8 g/dL — ABNORMAL LOW (ref 12.0–15.0)
MCH: 26.5 pg (ref 26.0–34.0)
MCHC: 33.5 g/dL (ref 30.0–36.0)
MCV: 78.9 fL (ref 78.0–100.0)
Platelets: 250 K/uL (ref 150–400)
RBC: 4.08 MIL/uL (ref 3.87–5.11)
RDW: 14.1 % (ref 11.5–15.5)
WBC: 6.6 K/uL (ref 4.0–10.5)

## 2012-10-25 LAB — BASIC METABOLIC PANEL
BUN: 12 mg/dL (ref 6–23)
CO2: 29 mEq/L (ref 19–32)
Calcium: 9.5 mg/dL (ref 8.4–10.5)
Creatinine, Ser: 1.06 mg/dL (ref 0.50–1.10)
Glucose, Bld: 95 mg/dL (ref 70–99)

## 2012-10-25 LAB — POCT I-STAT TROPONIN I: Troponin i, poc: 0 ng/mL (ref 0.00–0.08)

## 2012-10-25 LAB — BASIC METABOLIC PANEL WITH GFR
Chloride: 103 meq/L (ref 96–112)
GFR calc Af Amer: 70 mL/min — ABNORMAL LOW (ref >90)
GFR calc non Af Amer: 60 mL/min — ABNORMAL LOW (ref >90)
Potassium: 3.3 meq/L — ABNORMAL LOW (ref 3.5–5.1)
Sodium: 141 meq/L (ref 135–145)

## 2012-10-25 MED ORDER — POTASSIUM CHLORIDE CRYS ER 20 MEQ PO TBCR
40.0000 meq | EXTENDED_RELEASE_TABLET | Freq: Once | ORAL | Status: DC
  Filled 2012-10-25: qty 2

## 2012-10-25 NOTE — ED Notes (Signed)
[id number]<JY> Expected date:<BR> Expected time:<BR> Means of arrival:<BR> Comments:<BR> EMS - near syncope **rm 19

## 2012-10-25 NOTE — ED Notes (Signed)
Patient ambulated without assistance to restroom.  

## 2012-10-25 NOTE — ED Notes (Signed)
Per EMS pt comes from work where she was feeling hot and weak and blurred vision, pt went into break room with another coworker, pt states that she couldn't respond to what her coworker was telling her to do.  Per coworker pt passed out, but pt states she remembers having to be laid down on her side.

## 2012-10-25 NOTE — ED Provider Notes (Signed)
History     CSN: [id number]  Arrival date & time 10/25/12  8295   First MD Initiated Contact with Patient 10/25/12 [phone]      Chief Complaint  Patient presents with  . Near Syncope    (Consider location/radiation/quality/duration/timing/severity/associated sxs/prior treatment) HPI Comments: Patient was at work in a school, walking in the hallway when she began feeling hot developed some transient abdominal discomfort associated with mild nausea. She began to feel dizzy and felt faint. She tried to go the teacher's lounge to sit. She got to the water fountain and got some ice. In the teacher's lounge, witnesses one of which is here, reports the patient looked days, appeared like she was going to faint. She was awake and appeared to be listening to them but was not able to follow any other commands. They laid her down in the couch and eventually the patient seemed to arouse and began talking although not quite herself. I then EMS had been called and arrived and showed that her blood sugar was about 90. Patient reports she takes thyroid medication without eating in the morning, and normally does not eat until she arrived at work. She is yet eaten anything today. She also reports some slight increase in stressors do to him that the year activities at school. She reports that she sometimes has problems sleeping, has not slept well in the last week. Otherwise she denied any associated chest pain, palpitations, thunderclap headache, back or abdominal pain prior to symptom onset. The mild abdominal discomfort that she felt with her symptoms has resolved completely. So has her nausea. She seems more like her baseline according to the patient's brother and coworker.  The history is provided by the patient, a relative and a friend.    Past Medical History  Diagnosis Date  . Asymptomatic postmenopausal status (age-related) (natural)   . Thyrotoxicosis without mention of goiter or other cause, without  mention of thyrotoxic crisis or storm   . Cough   . Family history of asthma   . Migraine without aura, without mention of intractable migraine without mention of status migrainosus   . Pain in joint, pelvic region and thigh   . Acute upper respiratory infections of unspecified site   . Unspecified essential hypertension   . Allergic rhinitis, cause unspecified   . Warts     Past Surgical History  Procedure Laterality Date  . Cesarean section  [id number],7846    x2    Family History  Problem Relation Age of Onset  . Asthma Mother   . Asthma Sister   . Hypertension Other   . Colon cancer Neg Hx   . Diabetes Neg Hx   . Rectal cancer Neg Hx   . Stomach cancer Neg Hx     History  Substance Use Topics  . Smoking status: Never Smoker   . Smokeless tobacco: Never Used  . Alcohol Use: No    OB History   Grav Para Term Preterm Abortions TAB SAB Ect Mult Living                  Review of Systems  Constitutional: Negative for fever, chills and appetite change.  HENT: Negative for congestion and sinus pressure.   Eyes: Negative for visual disturbance.  Respiratory: Negative for shortness of breath.   Cardiovascular: Negative for chest pain.  Gastrointestinal: Positive for abdominal pain. Negative for nausea, vomiting, diarrhea and blood in stool.  Musculoskeletal: Negative for back pain.  Skin: Negative for  color change, pallor and rash.  Neurological: Positive for syncope, light-headedness and headaches.  All other systems reviewed and are negative.    Allergies  Codeine  Home Medications   Current Outpatient Rx  Name  Route  Sig  Dispense  Refill  . Cholecalciferol (VITAMIN D3) 1000 UNITS CAPS   Oral   Take by mouth.           Marland Kitchen ibuprofen (ADVIL,MOTRIN) 600 MG tablet   Oral   Take 600 mg by mouth every 6 (six) hours as needed.           Marland Kitchen KLOR-CON M20 20 MEQ tablet      TAKE ONE TABLET BY MOUTH EVERY DAY   30 tablet   7   . naproxen (NAPROSYN) 500 MG  tablet   Oral   Take 1 tablet (500 mg total) by mouth 2 (two) times daily as needed (use pc).   60 tablet   0   . SYNTHROID 75 MCG tablet      TAKE ONE TABLET BY MOUTH EVERY DAY   30 tablet   7     Dispense as written.   Marya Landry 40-10-25 MG TABS      TAKE ONE TABLET BY MOUTH EVERY DAY   90 tablet   2   . vitamin E 400 UNIT capsule   Oral   Take 400 Units by mouth daily.           . metroNIDAZOLE (METROCREAM) 0.75 % cream   Topical   Apply topically 2 (two) times daily.             BP 104/68  Pulse 69  Resp 18  Ht 5' 3.5" (1.613 m)  SpO2 100%  LMP 02/05/2008  Physical Exam  Nursing note and vitals reviewed. Constitutional: She is oriented to person, place, and time. She appears well-developed and well-nourished. No distress.  HENT:  Head: Normocephalic and atraumatic.  Eyes: Conjunctivae and EOM are normal. Pupils are equal, round, and reactive to light. No scleral icterus.  Cardiovascular: Normal rate, regular rhythm and intact distal pulses.   No murmur heard. Pulmonary/Chest: Effort normal. No respiratory distress. She has no wheezes. She has no rales.  Abdominal: Soft. She exhibits no distension. There is no tenderness. There is no rebound and no guarding.  Musculoskeletal: She exhibits no edema.  Neurological: She is alert and oriented to person, place, and time. No cranial nerve deficit. Coordination normal.  Skin: Skin is warm and dry. No rash noted. She is not diaphoretic.  Psychiatric: She has a normal mood and affect.    ED Course  Procedures (including critical care time)  Labs Reviewed  CBC - Abnormal; Notable for the following:    Hemoglobin 10.8 (*)    HCT 32.2 (*)    All other components within normal limits  BASIC METABOLIC PANEL - Abnormal; Notable for the following:    Potassium 3.3 (*)    GFR calc non Af Amer 60 (*)    GFR calc Af Amer 70 (*)    All other components within normal limits  GLUCOSE, CAPILLARY  POCT I-STAT  TROPONIN I   No results found.   1. Syncope   2. Hypokalemia   3. Anemia     ECG at time 08:54 shows SR at rate 66, borderline first degree AV block, normal axis, non specific T wave abn's anteriorly.  No prior ECG available for comparison.  Abn ECG.    RA sat  is 100% and I intepret to be normal.    Patient is found to be mildly anemic, stable compared to prior values. Potassium is minimally low at 3.3, but could explain some non specific ECG changes. She is given some oral replacement of her potassium. Patient can followup with her primary care physician regarding her mild anemia.  12:47 PM Pt feels improved, has eaten, ambulated, back to baseline.  MDM  Pt with syncope at work, had prodrome, no CP, HA, back pain or other concerning symptoms prior to episode, return to baseline.  Work up is neg here.  No overt ischemic changes on ECG and with no ACS symptoms, doubt of any significance today.  Troponin is neg.  No arrythmia on monitor here in the ED.    Not orthostatic, will allow to eat and discharge to home if feeling improved.          Gavin Pound. Lolitha Tortora, MD 10/25/12 1247

## 2013-01-21 ENCOUNTER — Other Ambulatory Visit: Payer: Self-pay | Admitting: *Deleted

## 2013-01-21 DIAGNOSIS — Z0000 Encounter for general adult medical examination without abnormal findings: Secondary | ICD-10-CM

## 2013-01-31 ENCOUNTER — Other Ambulatory Visit (INDEPENDENT_AMBULATORY_CARE_PROVIDER_SITE_OTHER): Payer: BC Managed Care – PPO

## 2013-01-31 DIAGNOSIS — Z0000 Encounter for general adult medical examination without abnormal findings: Secondary | ICD-10-CM

## 2013-01-31 LAB — CBC WITH DIFFERENTIAL/PLATELET
Basophils Relative: 0 % (ref 0.0–3.0)
Eosinophils Relative: 1.8 % (ref 0.0–5.0)
MCV: 82.3 fl (ref 78.0–100.0)
Monocytes Absolute: 0.7 10*3/uL (ref 0.1–1.0)
Neutrophils Relative %: 46.9 % (ref 43.0–77.0)
RBC: 4.2 Mil/uL (ref 3.87–5.11)
WBC: 7.3 10*3/uL (ref 4.5–10.5)

## 2013-01-31 LAB — BASIC METABOLIC PANEL
Calcium: 9.7 mg/dL (ref 8.4–10.5)
Chloride: 104 mEq/L (ref 96–112)
Creatinine, Ser: 1.2 mg/dL (ref 0.4–1.2)
GFR: 63.93 mL/min (ref >60.00)

## 2013-01-31 LAB — URINALYSIS, ROUTINE W REFLEX MICROSCOPIC
RBC / HPF: NONE SEEN (ref 0–?)
Specific Gravity, Urine: 1.01 (ref 1.000–1.030)
Total Protein, Urine: NEGATIVE
Urine Glucose: NEGATIVE
WBC, UA: NONE SEEN (ref 0–?)
pH: 6 (ref 5.0–8.0)

## 2013-01-31 LAB — LIPID PANEL
HDL: 91.6 mg/dL (ref >39.00)
LDL Cholesterol: 46 mg/dL (ref 0–99)
Total CHOL/HDL Ratio: 2
Triglycerides: 32 mg/dL (ref 0.0–149.0)

## 2013-01-31 LAB — HEPATIC FUNCTION PANEL
Bilirubin, Direct: 0.1 mg/dL (ref 0.0–0.3)
Total Bilirubin: 0.6 mg/dL (ref 0.3–1.2)

## 2013-02-03 ENCOUNTER — Telehealth: Payer: Self-pay | Admitting: Internal Medicine

## 2013-02-03 NOTE — Telephone Encounter (Signed)
Caller Name: Nhyira  Phone: [phone]  Patient: Tina, Rivera  Gender: Female  DOB: [birth year]  Age: 52 Years  PCP: Plotnikov, Alex (Adults only)   Does the office need to follow up with this patient?: Yes  Instructions For The Office: recommendations  RN Note:  pain after stopping urination, no response from labs and urine testing done in office 01/31/13; has appt scheduled for 02/05/13 in office. Patient requests review of labs, does she need to be seen?   Reason For Call & Symptoms: no MENSES for 5 years, onset of spotting 8/1/814 when she wipes, frequent urination  Reviewed Health History In EMR: Yes  Reviewed Medications In EMR: Yes  Reviewed Allergies In EMR: Yes  Reviewed Surgeries / Procedures: Yes  Date of Onset of Symptoms: 02/03/2013   Guideline(s) Used:  Vaginal Bleeding - Abnormal;   Urination Pain - Female  Disposition Per Guideline:  See Today in Office  Reason For Disposition Reached:  Age > 50 years  Advice Given:  Fluids:  Drink extra fluids. Drink 8-10 glasses of liquids a day (Reason: to produce a dilute, non-irritating urine).   Call Back If:  You become worse.  Patient Will Follow Care Advice:  YES

## 2013-02-04 ENCOUNTER — Telehealth: Payer: Self-pay | Admitting: *Deleted

## 2013-02-04 NOTE — Telephone Encounter (Signed)
Pt requests lab results.  Please advise.

## 2013-02-05 ENCOUNTER — Ambulatory Visit (INDEPENDENT_AMBULATORY_CARE_PROVIDER_SITE_OTHER): Payer: BC Managed Care – PPO | Admitting: Internal Medicine

## 2013-02-05 ENCOUNTER — Encounter: Payer: Self-pay | Admitting: Internal Medicine

## 2013-02-05 VITALS — BP 128/90 | HR 76 | Temp 98.9°F | Resp 16 | Ht 63.5 in | Wt 179.0 lb

## 2013-02-05 DIAGNOSIS — Z0000 Encounter for general adult medical examination without abnormal findings: Secondary | ICD-10-CM

## 2013-02-05 DIAGNOSIS — I10 Essential (primary) hypertension: Secondary | ICD-10-CM

## 2013-02-05 DIAGNOSIS — E890 Postprocedural hypothyroidism: Secondary | ICD-10-CM

## 2013-02-05 MED ORDER — SYNTHROID 75 MCG PO TABS: ORAL_TABLET | ORAL | Status: DC

## 2013-02-05 MED ORDER — FERROUS SULFATE 325 (65 FE) MG PO TABS: 325.0000 mg | ORAL_TABLET | Freq: Every day | ORAL | Status: DC

## 2013-02-05 MED ORDER — OLMESARTAN-AMLODIPINE-HCTZ 40-10-25 MG PO TABS: ORAL_TABLET | ORAL | Status: DC

## 2013-02-05 NOTE — Assessment & Plan Note (Signed)
8/13, 8/14 We discussed age appropriate health related issues, including available/recomended screening tests and vaccinations. We discussed a need for adhering to healthy diet and exercise. Labs/EKG were reviewed/ordered. All questions were answered. She can sch PAP w/[facility] discussed

## 2013-02-05 NOTE — Assessment & Plan Note (Signed)
Continue with current prescription therapy as reflected on the Med list.  

## 2013-02-05 NOTE — Progress Notes (Signed)
   Subjective:     HPI  The patient is here for a wellness exam. The patient has been doing well overall without major physical or psychological issues going on lately. She re-married No period in 5 years (since 52 yo)   The patient presents for a follow-up of  chronic hypertension, chronic hypothyroidism controlled with medicines.  BP Readings from Last 3 Encounters:  02/05/13 128/90  10/25/12 108/75  04/05/12 118/75   Wt Readings from Last 3 Encounters:  02/05/13 179 lb (81.194 kg)  04/05/12 176 lb (79.833 kg)  03/25/12 176 lb 8 oz (80.06 kg)      Review of Systems  Constitutional: Negative for chills, activity change, appetite change, fatigue and unexpected weight change.  HENT: Negative for congestion, mouth sores and sinus pressure.   Eyes: Negative for visual disturbance.  Respiratory: Negative for cough and chest tightness.   Gastrointestinal: Negative for nausea, vomiting, abdominal pain and abdominal distention.       Heartburn  Genitourinary: Negative for frequency, difficulty urinating and vaginal pain.  Musculoskeletal: Negative for back pain and gait problem.  Skin: Negative for pallor, rash and wound.  Neurological: Negative for dizziness, tremors, weakness, numbness and headaches.  Psychiatric/Behavioral: Negative for confusion and sleep disturbance.       Objective:   Physical Exam  Constitutional: She appears well-developed and well-nourished. No distress.  HENT:  Head: Normocephalic.  Right Ear: External ear normal.  Left Ear: External ear normal.  Nose: Nose normal.  Mouth/Throat: Oropharynx is clear and moist.  Eyes: Conjunctivae are normal. Pupils are equal, round, and reactive to light. Right eye exhibits no discharge. Left eye exhibits no discharge.  Neck: Normal range of motion. Neck supple. No JVD present. No tracheal deviation present. No thyromegaly present.  Cardiovascular: Normal rate, regular rhythm and normal heart sounds.    Pulmonary/Chest: No stridor. No respiratory distress. She has no wheezes.  Abdominal: Soft. Bowel sounds are normal. She exhibits no distension and no mass. There is no tenderness. There is no rebound and no guarding.  Musculoskeletal: She exhibits no edema and no tenderness.  Lymphadenopathy:    She has no cervical adenopathy.  Neurological: She displays normal reflexes. No cranial nerve deficit. She exhibits normal muscle tone. Coordination normal.  Skin: No rash noted. No erythema.  Psychiatric: She has a normal mood and affect. Her behavior is normal. Judgment and thought content normal.       Lab Results  Component Value Date   WBC 7.3 01/31/2013   HGB 11.6* 01/31/2013   HCT 34.6* 01/31/2013   PLT 283.0 01/31/2013   CHOL 144 01/31/2013   TRIG 32.0 01/31/2013   HDL 91.60 01/31/2013   ALT 16 01/31/2013   AST 22 01/31/2013   NA 140 01/31/2013   K 3.7 01/31/2013   CL 104 01/31/2013   CREATININE 1.2 01/31/2013   BUN 14 01/31/2013   CO2 30 01/31/2013   TSH 0.50 01/31/2013      Assessment & Plan:

## 2013-03-21 ENCOUNTER — Ambulatory Visit (INDEPENDENT_AMBULATORY_CARE_PROVIDER_SITE_OTHER): Payer: BC Managed Care – PPO | Admitting: Internal Medicine

## 2013-03-21 ENCOUNTER — Other Ambulatory Visit (HOSPITAL_COMMUNITY)
Admission: RE | Admit: 2013-03-21 | Discharge: 2013-03-21 | Disposition: A | Discharge status: Home / Self Care | Payer: BC Managed Care – PPO | Source: Ambulatory Visit | Attending: Internal Medicine | Admitting: Internal Medicine

## 2013-03-21 ENCOUNTER — Encounter: Payer: Self-pay | Admitting: Internal Medicine

## 2013-03-21 VITALS — BP 120/80 | HR 83 | Temp 97.9°F | Wt 176.0 lb

## 2013-03-21 DIAGNOSIS — Z124 Encounter for screening for malignant neoplasm of cervix: Secondary | ICD-10-CM

## 2013-03-21 DIAGNOSIS — Z01419 Encounter for gynecological examination (general) (routine) without abnormal findings: Secondary | ICD-10-CM | POA: Insufficient documentation

## 2013-03-21 DIAGNOSIS — Z1239 Encounter for other screening for malignant neoplasm of breast: Secondary | ICD-10-CM

## 2013-03-21 NOTE — Patient Instructions (Signed)

## 2013-03-21 NOTE — Progress Notes (Signed)
Subjective:    Patient ID: Tina Rivera, female    DOB: [birth year], 52 y.o.   MRN: [id number]  HPI  Pt presents to the clinic today for her pap smear. She is postmenopausal. Her last Pap was 2011. Her last mammogram 2011  . She denies vaginal complaints.  Review of Systems      Past Medical History  Diagnosis Date  . Asymptomatic postmenopausal status (age-related) (natural)   . Thyrotoxicosis without mention of goiter or other cause, without mention of thyrotoxic crisis or storm   . Cough   . Family history of asthma   . Migraine without aura, without mention of intractable migraine without mention of status migrainosus   . Pain in joint, pelvic region and thigh   . Acute upper respiratory infections of unspecified site   . Unspecified essential hypertension   . Allergic rhinitis, cause unspecified   . Warts     Current Outpatient Prescriptions  Medication Sig Dispense Refill  . Cholecalciferol (VITAMIN D3) 1000 UNITS CAPS Take by mouth.        . ferrous sulfate 325 (65 FE) MG tablet Take 1 tablet (325 mg total) by mouth daily.  30 tablet  6  . ibuprofen (ADVIL,MOTRIN) 600 MG tablet Take 600 mg by mouth every 6 (six) hours as needed.        Marland Kitchen KLOR-CON M20 20 MEQ tablet TAKE ONE TABLET BY MOUTH EVERY DAY  30 tablet  7  . metroNIDAZOLE (METROCREAM) 0.75 % cream Apply topically 2 (two) times daily.        . naproxen (NAPROSYN) 500 MG tablet Take 1 tablet (500 mg total) by mouth 2 (two) times daily as needed (use pc).  60 tablet  0  . Olmesartan-Amlodipine-HCTZ (TRIBENZOR) 40-10-25 MG TABS TAKE ONE TABLET BY MOUTH EVERY DAY  90 tablet  3  . SYNTHROID 75 MCG tablet TAKE ONE TABLET BY MOUTH EVERY DAY  90 tablet  3  . vitamin E 400 UNIT capsule Take 400 Units by mouth daily.         No current facility-administered medications for this visit.    Allergies  Allergen Reactions  . Codeine Hives and Nausea Only    REACTION: nausea    Family History  Problem Relation Age of  Onset  . Asthma Mother   . Asthma Sister   . Hypertension Other   . Colon cancer Neg Hx   . Diabetes Neg Hx   . Rectal cancer Neg Hx   . Stomach cancer Neg Hx     History   Social History  . Marital Status: Married    Spouse Name: N/A    Number of Children: N/A  . Years of Education: N/A   Occupational History  . teacher aid    Social History Main Topics  . Smoking status: Never Smoker   . Smokeless tobacco: Never Used  . Alcohol Use: No  . Drug Use: No  . Sexual Activity: Yes   Other Topics Concern  . Not on file   Social History Narrative   Does drink fair amount of caffeine     Constitutional: Denies fever, malaise, fatigue, headache or abrupt weight changes.  GU: Denies urgency, frequency, pain with urination, burning sensation, blood in urine, odor or discharge.   No other specific complaints in a complete review of systems (except as listed in HPI above).  Objective:   Physical Exam   Constitutional:  Alert, oriented x 4, well developed, well nourished  in no apparent distress. Cardiovascular: Normal rate and rhythm. S1,S2 noted.  No murmur, rubs or gallops noted. No JVD or BLE edema. No carotid bruits noted. Pulmonary/Chest: Normal effort and positive vesicular breath sounds. No respiratory distress. No wheezes, rales or ronchi noted.  Abdomen: Soft and nontender. Normal bowel sounds, no bruits noted. No distention or masses noted. Liver, spleen and kidneys non palpable. Genitourinary: Normal female anatomy. Uterus midline, anterior and soft. No CMT or discharge noted. Adenexa non palpable. Breast with fibrocystic changes, no masses.  .       Assessment & Plan:   Screening for cervical cancer with routine gyn exam:  Pap smear obtained- will send off for processing and call you with the results Bimaunal and breast exam performed-normal Will repeat pap in 3 years unless comes back abnormal Will go ahead and order mammogram  RTC as needed

## 2013-03-26 ENCOUNTER — Ambulatory Visit
Admission: RE | Admit: 2013-03-26 | Discharge: 2013-03-26 | Disposition: A | Discharge status: Home / Self Care | Payer: BC Managed Care – PPO | Source: Ambulatory Visit | Attending: Internal Medicine | Admitting: Internal Medicine

## 2013-03-26 DIAGNOSIS — Z1239 Encounter for other screening for malignant neoplasm of breast: Secondary | ICD-10-CM

## 2013-04-24 ENCOUNTER — Other Ambulatory Visit: Payer: Self-pay

## 2013-05-12 ENCOUNTER — Other Ambulatory Visit: Payer: Self-pay | Admitting: Internal Medicine

## 2013-05-13 ENCOUNTER — Other Ambulatory Visit: Payer: Self-pay | Admitting: Internal Medicine

## 2013-05-14 ENCOUNTER — Other Ambulatory Visit: Payer: Self-pay | Admitting: Internal Medicine

## 2013-05-16 NOTE — Telephone Encounter (Signed)
Refill done.  

## 2013-05-18 ENCOUNTER — Other Ambulatory Visit: Payer: Self-pay | Admitting: Internal Medicine

## 2013-11-29 ENCOUNTER — Other Ambulatory Visit: Payer: Self-pay | Admitting: Internal Medicine

## 2014-02-14 ENCOUNTER — Other Ambulatory Visit: Payer: Self-pay | Admitting: Internal Medicine

## 2014-03-08 ENCOUNTER — Other Ambulatory Visit: Payer: Self-pay | Admitting: Internal Medicine

## 2014-03-15 ENCOUNTER — Other Ambulatory Visit: Payer: Self-pay | Admitting: Internal Medicine

## 2014-03-18 ENCOUNTER — Telehealth: Payer: Self-pay | Admitting: Internal Medicine

## 2014-03-18 NOTE — Telephone Encounter (Signed)
This refill was sent #90 on 02/16/14 to [facility]. She should have enough to last until her 04/17/14 appt.

## 2014-03-18 NOTE — Telephone Encounter (Signed)
Patient couldn't get in with Dr. Macario GoldsPlot until the 30th of Oct.  She is request a month refill on synthroid until appointment.  [facility] uses [facility] on [address].

## 2014-03-23 ENCOUNTER — Telehealth: Payer: Self-pay | Admitting: Internal Medicine

## 2014-03-23 ENCOUNTER — Other Ambulatory Visit: Payer: Self-pay | Admitting: Internal Medicine

## 2014-03-23 NOTE — Telephone Encounter (Signed)
Patient is requesting refill on synthroid and tribenzor until she can get in at the end of month.  Patients is out of meds.  [facility] uses [facility] on [address]

## 2014-03-24 MED ORDER — OLMESARTAN-AMLODIPINE-HCTZ 40-10-25 MG PO TABS: ORAL_TABLET | ORAL | Status: DC

## 2014-03-24 MED ORDER — SYNTHROID 75 MCG PO TABS: ORAL_TABLET | ORAL | Status: DC

## 2014-03-24 NOTE — Telephone Encounter (Signed)
Notified pt 30 day was [facility] until appt...Raechel Chute/lmb

## 2014-04-17 ENCOUNTER — Ambulatory Visit (INDEPENDENT_AMBULATORY_CARE_PROVIDER_SITE_OTHER): Payer: BC Managed Care – PPO | Admitting: Internal Medicine

## 2014-04-17 ENCOUNTER — Encounter: Payer: Self-pay | Admitting: Internal Medicine

## 2014-04-17 ENCOUNTER — Other Ambulatory Visit: Payer: Self-pay | Admitting: Internal Medicine

## 2014-04-17 VITALS — BP 118/82 | HR 78 | Temp 98.0°F | Ht 63.5 in | Wt 185.5 lb

## 2014-04-17 DIAGNOSIS — I10 Essential (primary) hypertension: Secondary | ICD-10-CM

## 2014-04-17 DIAGNOSIS — E032 Hypothyroidism due to medicaments and other exogenous substances: Secondary | ICD-10-CM

## 2014-04-17 DIAGNOSIS — M25511 Pain in right shoulder: Secondary | ICD-10-CM

## 2014-04-17 DIAGNOSIS — Z0000 Encounter for general adult medical examination without abnormal findings: Secondary | ICD-10-CM

## 2014-04-17 MED ORDER — NAPROXEN 500 MG PO TABS: 500.0000 mg | ORAL_TABLET | Freq: Two times a day (BID) | ORAL | Status: DC | PRN

## 2014-04-17 MED ORDER — OLMESARTAN-AMLODIPINE-HCTZ 40-10-25 MG PO TABS: ORAL_TABLET | ORAL | Status: DC

## 2014-04-17 MED ORDER — SYNTHROID 75 MCG PO TABS: ORAL_TABLET | ORAL | Status: DC

## 2014-04-17 MED ORDER — POTASSIUM CHLORIDE CRYS ER 20 MEQ PO TBCR: EXTENDED_RELEASE_TABLET | ORAL | Status: DC

## 2014-04-17 NOTE — Progress Notes (Signed)
Pre visit review using our clinic review tool, if applicable. No additional management support is needed unless otherwise documented below in the visit note. 

## 2014-04-17 NOTE — Assessment & Plan Note (Signed)
Continue with current prescription therapy as reflected on the Med list.  

## 2014-04-17 NOTE — Assessment & Plan Note (Signed)
R shoulder MSK 2015

## 2014-04-17 NOTE — Patient Instructions (Signed)
[email or web address]  - Rotator cuff strain/tear exercises

## 2014-04-19 NOTE — Progress Notes (Signed)
   Subjective:     HPI  The patient is here for a f/u exam. The patient has been doing well overall without major physical or psychological issues going on lately. She re-married No period in 5 years (since 53 yo)   The patient presents for a follow-up of  chronic hypertension, chronic hypothyroidism controlled with medicines.  BP Readings from Last 3 Encounters:  04/17/14 118/82  03/21/13 120/80  02/05/13 128/90   Wt Readings from Last 3 Encounters:  04/17/14 185 lb 8 oz (84.142 kg)  03/21/13 176 lb (79.833 kg)  02/05/13 179 lb (81.194 kg)      Review of Systems  Constitutional: Negative for chills, activity change, appetite change, fatigue and unexpected weight change.  HENT: Negative for congestion, mouth sores and sinus pressure.   Eyes: Negative for visual disturbance.  Respiratory: Negative for cough and chest tightness.   Gastrointestinal: Negative for nausea, vomiting, abdominal pain and abdominal distention.       Heartburn  Genitourinary: Negative for frequency, difficulty urinating and vaginal pain.  Musculoskeletal: Negative for back pain and gait problem.  Skin: Negative for pallor, rash and wound.  Neurological: Negative for dizziness, tremors, weakness, numbness and headaches.  Psychiatric/Behavioral: Negative for confusion and sleep disturbance.       Objective:   Physical Exam     Lab Results  Component Value Date   WBC 7.3 01/31/2013   HGB 11.6* 01/31/2013   HCT 34.6* 01/31/2013   PLT 283.0 01/31/2013   CHOL 144 01/31/2013   TRIG 32.0 01/31/2013   HDL 91.60 01/31/2013   ALT 16 01/31/2013   AST 22 01/31/2013   NA 140 01/31/2013   K 3.7 01/31/2013   CL 104 01/31/2013   CREATININE 1.2 01/31/2013   BUN 14 01/31/2013   CO2 30 01/31/2013   TSH 0.50 01/31/2013      Assessment & Plan:

## 2014-04-20 ENCOUNTER — Telehealth: Payer: Self-pay | Admitting: Internal Medicine

## 2014-04-20 NOTE — Telephone Encounter (Signed)
emmi emailed °

## 2014-04-23 ENCOUNTER — Other Ambulatory Visit (INDEPENDENT_AMBULATORY_CARE_PROVIDER_SITE_OTHER): Payer: BC Managed Care – PPO

## 2014-04-23 DIAGNOSIS — Z0000 Encounter for general adult medical examination without abnormal findings: Secondary | ICD-10-CM

## 2014-04-23 LAB — CBC WITH DIFFERENTIAL/PLATELET
Basophils Absolute: 0 10*3/uL (ref 0.0–0.1)
Basophils Relative: 0.7 % (ref 0.0–3.0)
Eosinophils Absolute: 0.2 10*3/uL (ref 0.0–0.7)
Eosinophils Relative: 2.8 % (ref 0.0–5.0)
HCT: 36.1 % (ref 36.0–46.0)
Hemoglobin: 11.8 g/dL — ABNORMAL LOW (ref 12.0–15.0)
Lymphocytes Relative: 40.1 % (ref 12.0–46.0)
Lymphs Abs: 2.4 10*3/uL (ref 0.7–4.0)
MCHC: 32.7 g/dL (ref 30.0–36.0)
MCV: 82.2 fl (ref 78.0–100.0)
Monocytes Absolute: 0.6 10*3/uL (ref 0.1–1.0)
Monocytes Relative: 10.4 % (ref 3.0–12.0)
Neutro Abs: 2.8 10*3/uL (ref 1.4–7.7)
Neutrophils Relative %: 46 % (ref 43.0–77.0)
Platelets: 279 10*3/uL (ref 150.0–400.0)
RBC: 4.39 Mil/uL (ref 3.87–5.11)
RDW: 15.3 % (ref 11.5–15.5)
WBC: 6.1 10*3/uL (ref 4.0–10.5)

## 2014-04-23 LAB — LIPID PANEL
Cholesterol: 132 mg/dL (ref 0–200)
HDL: 60.4 mg/dL (ref >39.00)
LDL Cholesterol: 62 mg/dL (ref 0–99)
NonHDL: 71.6
Total CHOL/HDL Ratio: 2
Triglycerides: 50 mg/dL (ref 0.0–149.0)
VLDL: 10 mg/dL (ref 0.0–40.0)

## 2014-04-23 LAB — BASIC METABOLIC PANEL
BUN: 14 mg/dL (ref 6–23)
CO2: 29 meq/L (ref 19–32)
CREATININE: 1.2 mg/dL (ref 0.4–1.2)
Calcium: 9.4 mg/dL (ref 8.4–10.5)
Chloride: 104 mEq/L (ref 96–112)
GFR: 60.58 mL/min (ref >60.00)
Glucose, Bld: 89 mg/dL (ref 70–99)
Potassium: 3.6 mEq/L (ref 3.5–5.1)
Sodium: 141 mEq/L (ref 135–145)

## 2014-04-23 LAB — TSH: TSH: 1.04 u[IU]/mL (ref 0.35–4.50)

## 2014-04-23 LAB — HEPATIC FUNCTION PANEL
ALT: 13 U/L (ref 0–35)
AST: 22 U/L (ref 0–37)
Albumin: 3.5 g/dL (ref 3.5–5.2)
Alkaline Phosphatase: 95 U/L (ref 39–117)
Bilirubin, Direct: 0.1 mg/dL (ref 0.0–0.3)
TOTAL PROTEIN: 7.3 g/dL (ref 6.0–8.3)
Total Bilirubin: 0.5 mg/dL (ref 0.2–1.2)

## 2014-04-23 LAB — URINALYSIS
Bilirubin Urine: NEGATIVE
Hgb urine dipstick: NEGATIVE
Ketones, ur: NEGATIVE
Leukocytes, UA: NEGATIVE
Nitrite: NEGATIVE
Specific Gravity, Urine: 1.01 (ref 1.000–1.030)
Total Protein, Urine: NEGATIVE
Urine Glucose: NEGATIVE
Urobilinogen, UA: 0.2 (ref 0.0–1.0)
pH: 6 (ref 5.0–8.0)

## 2014-05-12 ENCOUNTER — Other Ambulatory Visit: Payer: Self-pay | Admitting: Internal Medicine

## 2015-02-17 ENCOUNTER — Other Ambulatory Visit (INDEPENDENT_AMBULATORY_CARE_PROVIDER_SITE_OTHER): Payer: BC Managed Care – PPO

## 2015-02-17 ENCOUNTER — Encounter: Payer: Self-pay | Admitting: Internal Medicine

## 2015-02-17 ENCOUNTER — Ambulatory Visit (INDEPENDENT_AMBULATORY_CARE_PROVIDER_SITE_OTHER): Payer: BC Managed Care – PPO | Admitting: Internal Medicine

## 2015-02-17 VITALS — BP 110/88 | HR 83 | Ht 63.5 in | Wt 173.0 lb

## 2015-02-17 DIAGNOSIS — L719 Rosacea, unspecified: Secondary | ICD-10-CM

## 2015-02-17 DIAGNOSIS — Z0000 Encounter for general adult medical examination without abnormal findings: Secondary | ICD-10-CM | POA: Diagnosis not present

## 2015-02-17 DIAGNOSIS — I10 Essential (primary) hypertension: Secondary | ICD-10-CM

## 2015-02-17 DIAGNOSIS — Z23 Encounter for immunization: Secondary | ICD-10-CM

## 2015-02-17 DIAGNOSIS — E034 Atrophy of thyroid (acquired): Secondary | ICD-10-CM

## 2015-02-17 LAB — CBC WITH DIFFERENTIAL/PLATELET
BASOS ABS: 0 10*3/uL (ref 0.0–0.1)
Basophils Relative: 0.8 % (ref 0.0–3.0)
Eosinophils Absolute: 0.1 10*3/uL (ref 0.0–0.7)
Eosinophils Relative: 1.9 % (ref 0.0–5.0)
HEMATOCRIT: 33 % — AB (ref 36.0–46.0)
HEMOGLOBIN: 11 g/dL — AB (ref 12.0–15.0)
LYMPHS PCT: 38.3 % (ref 12.0–46.0)
Lymphs Abs: 2.4 10*3/uL (ref 0.7–4.0)
MCHC: 33.5 g/dL (ref 30.0–36.0)
MCV: 80.5 fl (ref 78.0–100.0)
MONOS PCT: 10.7 % (ref 3.0–12.0)
Monocytes Absolute: 0.7 10*3/uL (ref 0.1–1.0)
NEUTROS ABS: 3 10*3/uL (ref 1.4–7.7)
Neutrophils Relative %: 48.3 % (ref 43.0–77.0)
PLATELETS: 260 10*3/uL (ref 150.0–400.0)
RBC: 4.09 Mil/uL (ref 3.87–5.11)
RDW: 15.2 % (ref 11.5–15.5)
WBC: 6.3 10*3/uL (ref 4.0–10.5)

## 2015-02-17 LAB — HEPATIC FUNCTION PANEL
ALBUMIN: 4.1 g/dL (ref 3.5–5.2)
ALK PHOS: 92 U/L (ref 39–117)
ALT: 8 U/L (ref 0–35)
AST: 16 U/L (ref 0–37)
BILIRUBIN DIRECT: 0.1 mg/dL (ref 0.0–0.3)
TOTAL PROTEIN: 7.4 g/dL (ref 6.0–8.3)
Total Bilirubin: 0.3 mg/dL (ref 0.2–1.2)

## 2015-02-17 LAB — URINALYSIS
Bilirubin Urine: NEGATIVE
Hgb urine dipstick: NEGATIVE
KETONES UR: NEGATIVE
Leukocytes, UA: NEGATIVE
Nitrite: NEGATIVE
PH: 5.5 (ref 5.0–8.0)
SPECIFIC GRAVITY, URINE: 1.02 (ref 1.000–1.030)
TOTAL PROTEIN, URINE-UPE24: NEGATIVE
URINE GLUCOSE: NEGATIVE
Urobilinogen, UA: 0.2 (ref 0.0–1.0)

## 2015-02-17 LAB — LIPID PANEL
CHOL/HDL RATIO: 2
Cholesterol: 122 mg/dL (ref 0–200)
HDL: 58 mg/dL (ref >39.00)
LDL Cholesterol: 50 mg/dL (ref 0–99)
NONHDL: 63.93
Triglycerides: 72 mg/dL (ref 0.0–149.0)
VLDL: 14.4 mg/dL (ref 0.0–40.0)

## 2015-02-17 LAB — BASIC METABOLIC PANEL
BUN: 13 mg/dL (ref 6–23)
CALCIUM: 9.4 mg/dL (ref 8.4–10.5)
CHLORIDE: 103 meq/L (ref 96–112)
CO2: 29 meq/L (ref 19–32)
Creatinine, Ser: 1.1 mg/dL (ref 0.40–1.20)
GFR: 66.77 mL/min (ref >60.00)
Glucose, Bld: 86 mg/dL (ref 70–99)
Potassium: 3.5 mEq/L (ref 3.5–5.1)
SODIUM: 141 meq/L (ref 135–145)

## 2015-02-17 LAB — TSH: TSH: 0.28 u[IU]/mL — ABNORMAL LOW (ref 0.35–4.50)

## 2015-02-17 MED ORDER — OLMESARTAN-AMLODIPINE-HCTZ 40-10-25 MG PO TABS: ORAL_TABLET | ORAL | Status: DC

## 2015-02-17 MED ORDER — SYNTHROID 75 MCG PO TABS: ORAL_TABLET | ORAL | Status: DC

## 2015-02-17 MED ORDER — NAPROXEN 500 MG PO TABS: 500.0000 mg | ORAL_TABLET | Freq: Two times a day (BID) | ORAL | Status: DC | PRN

## 2015-02-17 NOTE — Assessment & Plan Note (Signed)
8/13, 8/14, 8/16 We discussed age appropriate health related issues, including available/recomended screening tests and vaccinations. We discussed a need for adhering to healthy diet and exercise. Labs/EKG were reviewed/ordered. All questions were answered.  Zostavax discussed

## 2015-02-17 NOTE — Assessment & Plan Note (Signed)
Tribenzor 

## 2015-02-17 NOTE — Progress Notes (Signed)
Subjective:  Patient ID: Tina Rivera, female    DOB: 1961-03-06  Age: 54 y.o. MRN: [id number]  CC: No chief complaint on file.   HPI Tina Rivera presents for a well exam  Outpatient Prescriptions Prior to Visit  Medication Sig Dispense Refill  . Cholecalciferol (VITAMIN D3) 1000 UNITS CAPS Take by mouth.      . metroNIDAZOLE (METROCREAM) 0.75 % cream Apply topically 2 (two) times daily.      . naproxen (NAPROSYN) 500 MG tablet Take 1 tablet (500 mg total) by mouth 2 (two) times daily as needed (use pc). 60 tablet 2  . Olmesartan-Amlodipine-HCTZ (TRIBENZOR) 40-10-25 MG TABS TAKE 1 TABLET EVERY DAY 30 tablet 11  . potassium chloride SA (KLOR-CON M20) 20 MEQ tablet EVERY DAY 30 tablet 11  . SYNTHROID 75 MCG tablet TAKE 1 TABLET EVERY DAY 30 tablet 11  . vitamin E 400 UNIT capsule Take 400 Units by mouth daily.      Marland Kitchen SYNTHROID 75 MCG tablet TAKE 1 TABLET EVERY DAY 90 tablet 2   No facility-administered medications prior to visit.    ROS Review of Systems  Constitutional: Negative for chills, activity change, appetite change, fatigue and unexpected weight change.  HENT: Negative for congestion, mouth sores and sinus pressure.   Eyes: Negative for visual disturbance.  Respiratory: Negative for cough and chest tightness.   Gastrointestinal: Negative for nausea and abdominal pain.  Genitourinary: Negative for frequency, difficulty urinating and vaginal pain.  Musculoskeletal: Negative for back pain and gait problem.  Skin: Negative for pallor and rash.  Neurological: Negative for dizziness, tremors, weakness, numbness and headaches.  Psychiatric/Behavioral: Negative for suicidal ideas, confusion and sleep disturbance. The patient is not nervous/anxious.     Objective:  BP 110/88 mmHg  Pulse 83  Ht 5' 3.5" (1.613 m)  Wt 173 lb (78.472 kg)  BMI 30.16 kg/m2  SpO2 96%  LMP 02/05/2008  BP Readings from Last 3 Encounters:  02/17/15 110/88  04/17/14 118/82  03/21/13  120/80    Wt Readings from Last 3 Encounters:  02/17/15 173 lb (78.472 kg)  04/17/14 185 lb 8 oz (84.142 kg)  03/21/13 176 lb (79.833 kg)    Physical Exam  Constitutional: She appears well-developed. No distress.  HENT:  Head: Normocephalic.  Right Ear: External ear normal.  Left Ear: External ear normal.  Nose: Nose normal.  Mouth/Throat: Oropharynx is clear and moist.  Eyes: Conjunctivae are normal. Pupils are equal, round, and reactive to light. Right eye exhibits no discharge. Left eye exhibits no discharge.  Neck: Normal range of motion. Neck supple. No JVD present. No tracheal deviation present. No thyromegaly present.  Cardiovascular: Normal rate, regular rhythm and normal heart sounds.   Pulmonary/Chest: No stridor. No respiratory distress. She has no wheezes.  Abdominal: Soft. Bowel sounds are normal. She exhibits no distension and no mass. There is no tenderness. There is no rebound and no guarding.  Musculoskeletal: She exhibits no edema or tenderness.  Lymphadenopathy:    She has no cervical adenopathy.  Neurological: She displays normal reflexes. No cranial nerve deficit. She exhibits normal muscle tone. Coordination normal.  Skin: No rash noted. No erythema.  Psychiatric: She has a normal mood and affect. Her behavior is normal. Judgment and thought content normal.    Lab Results  Component Value Date   WBC 6.1 04/23/2014   HGB 11.8* 04/23/2014   HCT 36.1 04/23/2014   PLT 279.0 04/23/2014   GLUCOSE 89 04/23/2014  CHOL 132 04/23/2014   TRIG 50.0 04/23/2014   HDL 60.40 04/23/2014   LDLCALC 62 04/23/2014   ALT 13 04/23/2014   AST 22 04/23/2014   NA 141 04/23/2014   K 3.6 04/23/2014   CL 104 04/23/2014   CREATININE 1.2 04/23/2014   BUN 14 04/23/2014   CO2 29 04/23/2014   TSH 1.04 04/23/2014    No results found.  Assessment & Plan:   There are no diagnoses linked to this encounter. I am having Tina Rivera maintain her vitamin E, Vitamin D3,  metroNIDAZOLE, SYNTHROID, Olmesartan-Amlodipine-HCTZ, naproxen, and potassium chloride SA.  No orders of the defined types were placed in this encounter.     Follow-up: No Follow-up on file.  Sonda Primes, MD

## 2015-02-17 NOTE — Progress Notes (Signed)
Pre visit review using our clinic review tool, if applicable. No additional management support is needed unless otherwise documented below in the visit note. 

## 2015-02-17 NOTE — Assessment & Plan Note (Signed)
Levothroid 

## 2015-02-17 NOTE — Assessment & Plan Note (Signed)
Metronidazole topical Rx

## 2015-02-25 ENCOUNTER — Encounter: Payer: Self-pay | Admitting: Internal Medicine

## 2015-05-02 ENCOUNTER — Other Ambulatory Visit: Payer: Self-pay | Admitting: Internal Medicine

## 2015-07-27 ENCOUNTER — Ambulatory Visit (INDEPENDENT_AMBULATORY_CARE_PROVIDER_SITE_OTHER): Payer: BC Managed Care – PPO | Admitting: Internal Medicine

## 2015-07-27 ENCOUNTER — Other Ambulatory Visit (INDEPENDENT_AMBULATORY_CARE_PROVIDER_SITE_OTHER): Payer: BC Managed Care – PPO

## 2015-07-27 ENCOUNTER — Encounter: Payer: Self-pay | Admitting: Internal Medicine

## 2015-07-27 VITALS — BP 118/80 | HR 79 | Wt 171.0 lb

## 2015-07-27 DIAGNOSIS — E034 Atrophy of thyroid (acquired): Secondary | ICD-10-CM | POA: Diagnosis not present

## 2015-07-27 DIAGNOSIS — I10 Essential (primary) hypertension: Secondary | ICD-10-CM

## 2015-07-27 DIAGNOSIS — R232 Flushing: Secondary | ICD-10-CM | POA: Diagnosis not present

## 2015-07-27 DIAGNOSIS — E038 Other specified hypothyroidism: Secondary | ICD-10-CM

## 2015-07-27 LAB — TSH: TSH: 0.7 u[IU]/mL (ref 0.35–4.50)

## 2015-07-27 LAB — T4, FREE: Free T4: 1.11 ng/dL (ref 0.60–1.60)

## 2015-07-27 MED ORDER — SYNTHROID 75 MCG PO TABS: ORAL_TABLET | ORAL | Status: DC

## 2015-07-27 NOTE — Assessment & Plan Note (Signed)
Chronic Better   

## 2015-07-27 NOTE — Assessment & Plan Note (Signed)
Chronic Tribenzor

## 2015-07-27 NOTE — Progress Notes (Signed)
Pre visit review using our clinic review tool, if applicable. No additional management support is needed unless otherwise documented below in the visit note. 

## 2015-07-27 NOTE — Progress Notes (Signed)
Subjective:  Patient ID: Tina Rivera, female    DOB: 1961-02-11  Age: 55 y.o. MRN: [id number]  CC: No chief complaint on file.   HPI Donnarae Rae Parcel presents for hypothyroidism, HTN, low K f/u. C/o hot flashes - better  Outpatient Prescriptions Prior to Visit  Medication Sig Dispense Refill  . Cholecalciferol (VITAMIN D3) 1000 UNITS CAPS Take by mouth.      Marland Kitchen KLOR-CON M20 20 MEQ tablet EVERY DAY 30 tablet 10  . metroNIDAZOLE (METROCREAM) 0.75 % cream Apply topically 2 (two) times daily.      . naproxen (NAPROSYN) 500 MG tablet Take 1 tablet (500 mg total) by mouth 2 (two) times daily as needed (use pc). 60 tablet 2  . Olmesartan-Amlodipine-HCTZ (TRIBENZOR) 40-10-25 MG TABS TAKE 1 TABLET EVERY DAY 30 tablet 11  . vitamin E 400 UNIT capsule Take 400 Units by mouth daily.      Marland Kitchen SYNTHROID 75 MCG tablet TAKE 1 TABLET EVERY DAY 30 tablet 11  . SYNTHROID 75 MCG tablet TAKE 1 TABLET EVERY DAY 30 tablet 2   No facility-administered medications prior to visit.    ROS Review of Systems  Constitutional: Positive for diaphoresis. Negative for chills, activity change, appetite change, fatigue and unexpected weight change.  HENT: Negative for congestion, dental problem, mouth sores and sinus pressure.   Eyes: Negative for visual disturbance.  Respiratory: Negative for cough, chest tightness and wheezing.   Gastrointestinal: Negative for nausea and abdominal pain.  Genitourinary: Negative for frequency, difficulty urinating and vaginal pain.  Musculoskeletal: Negative for back pain and gait problem.  Skin: Negative for pallor and rash.  Neurological: Negative for dizziness, tremors, weakness, numbness and headaches.  Psychiatric/Behavioral: Negative for confusion and sleep disturbance. The patient is not nervous/anxious.     Objective:  BP 118/80 mmHg  Pulse 79  Wt 171 lb (77.565 kg)  SpO2 97%  LMP 02/05/2008  BP Readings from Last 3 Encounters:  07/27/15 118/80  02/17/15  110/88  04/17/14 118/82    Wt Readings from Last 3 Encounters:  07/27/15 171 lb (77.565 kg)  02/17/15 173 lb (78.472 kg)  04/17/14 185 lb 8 oz (84.142 kg)    Physical Exam  Constitutional: She appears well-developed. No distress.  HENT:  Head: Normocephalic.  Right Ear: External ear normal.  Left Ear: External ear normal.  Nose: Nose normal.  Mouth/Throat: Oropharynx is clear and moist.  Eyes: Conjunctivae are normal. Pupils are equal, round, and reactive to light. Right eye exhibits no discharge. Left eye exhibits no discharge.  Neck: Normal range of motion. Neck supple. No JVD present. No tracheal deviation present. No thyromegaly present.  Cardiovascular: Normal rate, regular rhythm and normal heart sounds.   Pulmonary/Chest: No stridor. No respiratory distress. She has no wheezes.  Abdominal: Soft. Bowel sounds are normal. She exhibits no distension and no mass. There is no tenderness. There is no rebound and no guarding.  Musculoskeletal: She exhibits no edema or tenderness.  Lymphadenopathy:    She has no cervical adenopathy.  Neurological: She displays normal reflexes. No cranial nerve deficit. She exhibits normal muscle tone. Coordination normal.  Skin: No rash noted. No erythema.  Psychiatric: She has a normal mood and affect. Her behavior is normal. Judgment and thought content normal.    Lab Results  Component Value Date   WBC 6.3 02/17/2015   HGB 11.0* 02/17/2015   HCT 33.0* 02/17/2015   PLT 260.0 02/17/2015   GLUCOSE 86 02/17/2015   CHOL 122  02/17/2015   TRIG 72.0 02/17/2015   HDL 58.00 02/17/2015   LDLCALC 50 02/17/2015   ALT 8 02/17/2015   AST 16 02/17/2015   NA 141 02/17/2015   K 3.5 02/17/2015   CL 103 02/17/2015   CREATININE 1.10 02/17/2015   BUN 13 02/17/2015   CO2 29 02/17/2015   TSH 0.70 07/27/2015    No results found.  Assessment & Plan:   Diagnoses and all orders for this visit:  Hypothyroidism due to acquired atrophy of  thyroid  Essential hypertension  Hot flash not due to menopause   I have discontinued Ms. Oliveria SYNTHROID. I am also having her maintain her vitamin E, Vitamin D3, metroNIDAZOLE, Olmesartan-Amlodipine-HCTZ, naproxen, and KLOR-CON M20.  No orders of the defined types were placed in this encounter.     Follow-up: Return in about 6 months (around 01/24/2016) for Wellness Exam.  Sonda Primes, MD

## 2015-07-27 NOTE — Assessment & Plan Note (Signed)
Levothroid 

## 2015-07-31 ENCOUNTER — Other Ambulatory Visit: Payer: Self-pay | Admitting: Internal Medicine

## 2015-08-03 ENCOUNTER — Other Ambulatory Visit: Payer: Self-pay | Admitting: Internal Medicine

## 2015-08-03 MED ORDER — SYNTHROID 75 MCG PO TABS: ORAL_TABLET | ORAL | Status: DC

## 2015-08-03 NOTE — Addendum Note (Signed)
Addended by: Deatra James on: 08/03/2015 11:03 AM   Modules accepted: Orders

## 2016-02-27 ENCOUNTER — Other Ambulatory Visit: Payer: Self-pay | Admitting: Internal Medicine

## 2016-03-29 ENCOUNTER — Other Ambulatory Visit: Payer: Self-pay | Admitting: Internal Medicine

## 2016-07-20 ENCOUNTER — Other Ambulatory Visit: Payer: Self-pay | Admitting: Internal Medicine

## 2016-07-21 ENCOUNTER — Encounter (HOSPITAL_COMMUNITY): Payer: Self-pay

## 2016-07-21 ENCOUNTER — Emergency Department (HOSPITAL_COMMUNITY)
Admission: EM | Admit: 2016-07-21 | Discharge: 2016-07-21 | Disposition: A | Discharge status: Home / Self Care | Payer: BC Managed Care – PPO | Attending: Emergency Medicine | Admitting: Emergency Medicine

## 2016-07-21 DIAGNOSIS — E039 Hypothyroidism, unspecified: Secondary | ICD-10-CM | POA: Diagnosis not present

## 2016-07-21 DIAGNOSIS — M7070 Other bursitis of hip, unspecified hip: Secondary | ICD-10-CM

## 2016-07-21 DIAGNOSIS — M7072 Other bursitis of hip, left hip: Secondary | ICD-10-CM | POA: Insufficient documentation

## 2016-07-21 DIAGNOSIS — I10 Essential (primary) hypertension: Secondary | ICD-10-CM | POA: Diagnosis not present

## 2016-07-21 DIAGNOSIS — Y939 Activity, unspecified: Secondary | ICD-10-CM | POA: Diagnosis not present

## 2016-07-21 DIAGNOSIS — M25552 Pain in left hip: Secondary | ICD-10-CM | POA: Diagnosis present

## 2016-07-21 MED ORDER — OXYCODONE-ACETAMINOPHEN 5-325 MG PO TABS
2.0000 | ORAL_TABLET | Freq: Once | ORAL | Status: AC
  Administered 2016-07-21: 2 via ORAL
  Filled 2016-07-21: qty 2

## 2016-07-21 MED ORDER — METHOCARBAMOL 500 MG PO TABS: 500.0000 mg | ORAL_TABLET | Freq: Two times a day (BID) | ORAL | 0 refills | Status: DC

## 2016-07-21 MED ORDER — HYDROCODONE-ACETAMINOPHEN 5-325 MG PO TABS: 1.0000 | ORAL_TABLET | ORAL | 0 refills | Status: DC | PRN

## 2016-07-21 MED ORDER — PREDNISONE 10 MG (21) PO TBPK: 10.0000 mg | ORAL_TABLET | Freq: Every day | ORAL | 0 refills | Status: DC

## 2016-07-21 MED ORDER — ONDANSETRON 8 MG PO TBDP
8.0000 mg | ORAL_TABLET | Freq: Once | ORAL | Status: AC
  Administered 2016-07-21: 8 mg via ORAL
  Filled 2016-07-21: qty 1

## 2016-07-21 NOTE — ED Provider Notes (Addendum)
[facility]-EMERGENCY DEPT Provider Note   CSN: [id number] Arrival date & time: 07/21/16  0808     History   Chief Complaint Chief Complaint  Patient presents with  . Hip Pain    HPI Tina Rivera is a 56 y.o. female.  56 year old female presents with worsening left hip pain consistent with her prior history of bursitis. She denies any back pain or loss of bowel or bladder dysfunction. Denies any new injury. No fever or chills. No numbness to her left foot. Has been using NSAIDs without relief. Pain better with rest.      Past Medical History:  Diagnosis Date  . Acute upper respiratory infections of unspecified site   . Allergic rhinitis, cause unspecified   . Asymptomatic postmenopausal status (age-related) (natural)   . Cough   . Family history of asthma   . Migraine without aura, without mention of intractable migraine without mention of status migrainosus   . Pain in joint, pelvic region and thigh   . Thyrotoxicosis without mention of goiter or other cause, without mention of thyrotoxic crisis or storm   . Unspecified essential hypertension   . Warts     Patient Active Problem List   Diagnosis Date Noted  . Hot flash not due to menopause 07/27/2015  . Well adult exam 02/05/2012  . Heartburn 12/12/2010  . Alopecia 12/12/2010  . INSOMNIA, CHRONIC 08/26/2010  . Rosacea 08/26/2010  . SHOULDER PAIN 08/26/2010  . 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 GRAVES DISEASE 05/01/2008  . Hypothyroidism 05/01/2008  . ASYMPTOMATIC POSTMENOPAUSAL STATUS 02/28/2008  . COUGH 12/09/2007  . COMMON MIGRAINE 09/21/2007  . Essential hypertension 05/23/2007  . UPPER RESPIRATORY INFECTION (URI) 05/23/2007  . ALLERGIC RHINITIS 05/23/2007  . HIP PAIN 05/23/2007    Past Surgical History:  Procedure Laterality Date  . CESAREAN SECTION  [id number]   x2    OB History    No data available       Home Medications    Prior to Admission medications   Medication Sig Start Date End  Date Taking? Authorizing Provider  Cholecalciferol (VITAMIN D3) 1000 UNITS CAPS Take by mouth.      Historical Provider, MD  KLOR-CON M20 20 MEQ tablet EVERY DAY 03/29/16   Aleksei Plotnikov V, MD  metroNIDAZOLE (METROCREAM) 0.75 % cream Apply topically 2 (two) times daily.      Historical Provider, MD  naproxen (NAPROSYN) 500 MG tablet TAKE 1 TABLET (500 MG TOTAL) BY MOUTH 2 (TWO) TIMES DAILY AS NEEDED (USE PC). 07/21/16   Tresa GarterAleksei Plotnikov V, MD  SYNTHROID 75 MCG tablet TAKE 1 TABLET EVERY DAY 08/03/15   Aleksei Plotnikov V, MD  SYNTHROID 75 MCG tablet TAKE 1 TABLET EVERY DAY 08/03/15   Aleksei Plotnikov V, MD  TRIBENZOR 40-10-25 MG TABS TAKE 1 TABLET BY MOUTH EVERY DAY 02/27/16   Aleksei Plotnikov V, MD  vitamin E 400 UNIT capsule Take 400 Units by mouth daily.      Historical Provider, MD    Family History Family History  Problem Relation Age of Onset  . Asthma Mother   . Asthma Sister   . Hypertension Other   . Colon cancer Neg Hx   . Diabetes Neg Hx   . Rectal cancer Neg Hx   . Stomach cancer Neg Hx     Social History Social History  Substance Use Topics  . Smoking status: Never Smoker  . Smokeless tobacco: Never Used  . Alcohol use No     Allergies  Codeine   Review of Systems Review of Systems  All other systems reviewed and are negative.    Physical Exam Updated Vital Signs BP 120/81 (BP Location: Left Arm)   Pulse 82   Temp 97.8 F (36.6 C) (Oral)   Resp 17   LMP 02/05/2008   SpO2 100%   Physical Exam  Constitutional: She is oriented to person, place, and time. She appears well-developed and well-nourished.  Non-toxic appearance. No distress.  HENT:  Head: Normocephalic and atraumatic.  Eyes: Conjunctivae, EOM and lids are normal. Pupils are equal, round, and reactive to light.  Neck: Normal range of motion. Neck supple. No tracheal deviation present. No thyroid mass present.  Cardiovascular: Normal rate, regular rhythm and normal heart sounds.  Exam  reveals no gallop.   No murmur heard. Pulmonary/Chest: Effort normal and breath sounds normal. No stridor. No respiratory distress. She has no decreased breath sounds. She has no wheezes. She has no rhonchi. She has no rales.  Abdominal: Soft. Normal appearance and bowel sounds are normal. She exhibits no distension. There is no tenderness. There is no rebound and no CVA tenderness.  Musculoskeletal: Normal range of motion. She exhibits no edema or tenderness.  Full range of motion at the left hip without shortening or rotation. Patellar reflexes 2+. Neurovascularly intact at the left foot. Some pain with palpation to the left lateral hip. Skin intact. Dorsalis pedis pulse 2+  Neurological: She is alert and oriented to person, place, and time. She has normal strength. No cranial nerve deficit or sensory deficit. GCS eye subscore is 4. GCS verbal subscore is 5. GCS motor subscore is 6.  Skin: Skin is warm and dry. No abrasion and no rash noted.  Psychiatric: She has a normal mood and affect. Her speech is normal and behavior is normal.  Nursing note and vitals reviewed.    ED Treatments / Results  Labs (all labs ordered are listed, but only abnormal results are displayed) Labs Reviewed - No data to display  EKG  EKG Interpretation None       Radiology No results found.  Procedures Procedures (including critical care time)  Medications Ordered in ED Medications  oxyCODONE-acetaminophen (PERCOCET/ROXICET) 5-325 MG per tablet 2 tablet (not administered)  ondansetron (ZOFRAN-ODT) disintegrating tablet 8 mg (not administered)     Initial Impression / Assessment and Plan / ED Course  I have reviewed the triage vital signs and the nursing notes.  Pertinent labs & imaging results that were available during my care of the patient were reviewed by me and considered in my medical decision making (see chart for details).     Patient with likely recurrence of her bursitis.no sx  concerning for septic joint. Was treated symptomatically here return precautions given will be placed on corticosteroid taper  Final Clinical Impressions(s) / ED Diagnoses   Final diagnoses:  None    New Prescriptions New Prescriptions   No medications on file     Tina Nick, MD 07/21/16 8119    Tina Nick, MD 07/21/16 [phone]

## 2016-07-21 NOTE — ED Notes (Signed)
Pt verbalizes acute chronic pain to left hip unrelieved by home medication regimen. Pt denies injury to site.

## 2016-07-21 NOTE — ED Triage Notes (Signed)
Pt takes naproxen for bursitis hip pain.  Pt started having pain x 2 days ago and meds not helping.  Denies new injury.

## 2016-07-28 ENCOUNTER — Other Ambulatory Visit: Payer: Self-pay | Admitting: Internal Medicine

## 2016-08-09 ENCOUNTER — Telehealth: Payer: Self-pay | Admitting: Internal Medicine

## 2016-08-09 NOTE — Telephone Encounter (Signed)
Patient is requesting labs to be entered to check thyroid.  Patient has scheduled appt for 3/13 to follow up on this.  Asked patient if she would like to schedule physical.  Patient did not want to schedule that at this time.

## 2016-08-10 NOTE — Telephone Encounter (Signed)
Routing to dr plotnikov, please advise, thanks 

## 2016-08-10 NOTE — Telephone Encounter (Signed)
TSH, FT4 Thx

## 2016-08-14 ENCOUNTER — Telehealth: Payer: Self-pay

## 2016-08-14 DIAGNOSIS — E039 Hypothyroidism, unspecified: Secondary | ICD-10-CM

## 2016-08-14 NOTE — Telephone Encounter (Signed)
error 

## 2016-08-14 NOTE — Telephone Encounter (Signed)
Left message advising patient that lab orders are entered

## 2016-08-29 ENCOUNTER — Ambulatory Visit: Payer: BC Managed Care – PPO | Admitting: Internal Medicine

## 2016-08-31 ENCOUNTER — Other Ambulatory Visit: Payer: Self-pay | Admitting: Obstetrics and Gynecology

## 2016-09-01 LAB — CYTOLOGY - PAP

## 2016-09-13 ENCOUNTER — Other Ambulatory Visit (INDEPENDENT_AMBULATORY_CARE_PROVIDER_SITE_OTHER): Payer: BC Managed Care – PPO

## 2016-09-13 ENCOUNTER — Other Ambulatory Visit: Payer: BC Managed Care – PPO

## 2016-09-13 ENCOUNTER — Ambulatory Visit (INDEPENDENT_AMBULATORY_CARE_PROVIDER_SITE_OTHER)
Admission: RE | Admit: 2016-09-13 | Discharge: 2016-09-13 | Disposition: A | Discharge status: Home / Self Care | Payer: BC Managed Care – PPO | Source: Ambulatory Visit | Attending: Internal Medicine | Admitting: Internal Medicine

## 2016-09-13 ENCOUNTER — Ambulatory Visit (INDEPENDENT_AMBULATORY_CARE_PROVIDER_SITE_OTHER): Payer: BC Managed Care – PPO | Admitting: Internal Medicine

## 2016-09-13 ENCOUNTER — Encounter: Payer: Self-pay | Admitting: Internal Medicine

## 2016-09-13 DIAGNOSIS — M25552 Pain in left hip: Secondary | ICD-10-CM | POA: Diagnosis not present

## 2016-09-13 DIAGNOSIS — E034 Atrophy of thyroid (acquired): Secondary | ICD-10-CM

## 2016-09-13 DIAGNOSIS — E039 Hypothyroidism, unspecified: Secondary | ICD-10-CM | POA: Diagnosis not present

## 2016-09-13 DIAGNOSIS — I10 Essential (primary) hypertension: Secondary | ICD-10-CM

## 2016-09-13 DIAGNOSIS — M25559 Pain in unspecified hip: Secondary | ICD-10-CM | POA: Insufficient documentation

## 2016-09-13 LAB — T4, FREE: Free T4: 0.99 ng/dL (ref 0.60–1.60)

## 2016-09-13 LAB — TSH: TSH: 0.56 u[IU]/mL (ref 0.35–4.50)

## 2016-09-13 MED ORDER — NAPROXEN 500 MG PO TABS: 500.0000 mg | ORAL_TABLET | Freq: Two times a day (BID) | ORAL | 2 refills | Status: DC | PRN

## 2016-09-13 NOTE — Progress Notes (Signed)
Subjective:  Patient ID: Elpidio GaleaBeverly L Lawal, female    DOB: [birth year]  Age: 56 y.o. MRN: [id number]  CC: Hip Pain (left side ); Hypothyroidism; and Hypertension   HPI Lawanda CousinsBeverly L Mcclenahan presents for L hip pain  In 07/21/16 _ L hip and groin were swollen per pt. She is s/p ER eval The swelling resolved in 1 week. Pain is 2/10 now F/u HTN, hypothyroidism   Outpatient Medications Prior to Visit  Medication Sig Dispense Refill  . Cholecalciferol (VITAMIN D3) 1000 UNITS CAPS Take 1,000 Units by mouth daily.     Marland Kitchen. KLOR-CON M20 20 MEQ tablet EVERY DAY (Patient taking differently: TAKE 20MEQ ONCE A DAY) 30 tablet 5  . naproxen (NAPROSYN) 500 MG tablet TAKE 1 TABLET (500 MG TOTAL) BY MOUTH 2 (TWO) TIMES DAILY AS NEEDED (USE PC). (Patient taking differently: Take 500 mg by mouth 2 (two) times daily as needed for moderate pain (use pc). ) 60 tablet 2  . SYNTHROID 75 MCG tablet TAKE 1 TABLET EVERY DAY 30 tablet 11  . TRIBENZOR 40-10-25 MG TABS TAKE 1 TABLET BY MOUTH EVERY DAY 30 tablet 11  . vitamin E 400 UNIT capsule Take 400 Units by mouth daily.      Marland Kitchen. HYDROcodone-acetaminophen (NORCO/VICODIN) 5-325 MG tablet Take 1-2 tablets by mouth every 4 (four) hours as needed. 10 tablet 0  . methocarbamol (ROBAXIN) 500 MG tablet Take 1 tablet (500 mg total) by mouth 2 (two) times daily. 20 tablet 0  . predniSONE (STERAPRED UNI-PAK 21 TAB) 10 MG (21) TBPK tablet Take 1 tablet (10 mg total) by mouth daily. Take 6 tabs by mouth daily  for 2 days, then 5 tabs for 2 days, then 4 tabs for 2 days, then 3 tabs for 2 days, 2 tabs for 2 days, then 1 tab by mouth daily for 2 days 42 tablet 0  . SYNTHROID 75 MCG tablet TAKE 1 TABLET EVERY DAY (Patient not taking: Reported on 07/24/2016) 30 tablet 11  . SYNTHROID 75 MCG tablet Take 1 tablet (75 mcg total) by mouth daily before breakfast. Yearly physical w/labs are due must see MD for refills 30 tablet 0   No facility-administered medications prior to visit.     ROS Review  of Systems  Constitutional: Negative for activity change, appetite change, chills, fatigue and unexpected weight change.  HENT: Negative for congestion, mouth sores and sinus pressure.   Eyes: Negative for visual disturbance.  Respiratory: Negative for cough and chest tightness.   Gastrointestinal: Negative for abdominal pain and nausea.  Genitourinary: Negative for difficulty urinating, frequency and vaginal pain.  Musculoskeletal: Positive for arthralgias. Negative for back pain and gait problem.  Skin: Negative for pallor and rash.  Neurological: Negative for dizziness, tremors, weakness, numbness and headaches.  Psychiatric/Behavioral: Negative for confusion and sleep disturbance.    Objective:  BP 108/72   Pulse 83   Temp 98.3 F (36.8 C) (Oral)   Resp 16   Ht 5' 3.5" (1.613 m)   Wt 177 lb 4 oz (80.4 kg)   LMP 02/05/2008   SpO2 98%   BMI 30.91 kg/m   BP Readings from Last 3 Encounters:  09/13/16 108/72  07/21/16 120/75  07/27/15 118/80    Wt Readings from Last 3 Encounters:  09/13/16 177 lb 4 oz (80.4 kg)  07/27/15 171 lb (77.6 kg)  02/17/15 173 lb (78.5 kg)    Physical Exam  Constitutional: She appears well-developed. No distress.  HENT:  Head: Normocephalic.  Right Ear: External ear normal.  Left Ear: External ear normal.  Nose: Nose normal.  Mouth/Throat: Oropharynx is clear and moist.  Eyes: Conjunctivae are normal. Pupils are equal, round, and reactive to light. Right eye exhibits no discharge. Left eye exhibits no discharge.  Neck: Normal range of motion. Neck supple. No JVD present. No tracheal deviation present. No thyromegaly present.  Cardiovascular: Normal rate, regular rhythm and normal heart sounds.   Pulmonary/Chest: No stridor. No respiratory distress. She has no wheezes.  Abdominal: Soft. Bowel sounds are normal. She exhibits no distension and no mass. There is no tenderness. There is no rebound and no guarding.  Musculoskeletal: She exhibits  tenderness. She exhibits no edema.  Lymphadenopathy:    She has no cervical adenopathy.  Neurological: She displays normal reflexes. No cranial nerve deficit. She exhibits normal muscle tone. Coordination normal.  Skin: No rash noted. No erythema.  Psychiatric: She has a normal mood and affect. Her behavior is normal. Judgment and thought content normal.  L lat hip is tender laterally str leg (-) B   Lab Results  Component Value Date   WBC 6.3 02/17/2015   HGB 11.0 (L) 02/17/2015   HCT 33.0 (L) 02/17/2015   PLT 260.0 02/17/2015   GLUCOSE 86 02/17/2015   CHOL 122 02/17/2015   TRIG 72.0 02/17/2015   HDL 58.00 02/17/2015   LDLCALC 50 02/17/2015   ALT 8 02/17/2015   AST 16 02/17/2015   NA 141 02/17/2015   K 3.5 02/17/2015   CL 103 02/17/2015   CREATININE 1.10 02/17/2015   BUN 13 02/17/2015   CO2 29 02/17/2015   TSH 0.70 07/27/2015    No results found.  Assessment & Plan:   There are no diagnoses linked to this encounter. I have discontinued Ms. Philippi predniSONE, methocarbamol, and HYDROcodone-acetaminophen. I am also having her maintain her vitamin E, Vitamin D3, SYNTHROID, TRIBENZOR, KLOR-CON M20, and naproxen.  No orders of the defined types were placed in this encounter.    Follow-up: No Follow-up on file.  Sonda Primes, MD

## 2016-09-13 NOTE — Progress Notes (Signed)
Pre-visit discussion using our clinic review tool. No additional management support is needed unless otherwise documented below in the visit note.  

## 2016-09-13 NOTE — Assessment & Plan Note (Signed)
Levothroid °Labs °

## 2016-09-13 NOTE — Assessment & Plan Note (Signed)
?  L hip bursitis Pt declined inj X ray Re-new Naproxen

## 2016-09-13 NOTE — Assessment & Plan Note (Signed)
Tribenzor 

## 2016-09-14 ENCOUNTER — Other Ambulatory Visit: Payer: Self-pay | Admitting: Internal Medicine

## 2016-09-26 ENCOUNTER — Other Ambulatory Visit: Payer: Self-pay | Admitting: Internal Medicine

## 2017-02-26 ENCOUNTER — Other Ambulatory Visit: Payer: Self-pay | Admitting: Internal Medicine

## 2017-03-08 ENCOUNTER — Telehealth: Payer: Self-pay | Admitting: Internal Medicine

## 2017-03-08 DIAGNOSIS — Z0000 Encounter for general adult medical examination without abnormal findings: Secondary | ICD-10-CM

## 2017-03-08 NOTE — Telephone Encounter (Signed)
Pt called in, she has an cpe appt on 10/3.  She would like to go to the lab before the appt.  She would like the labs put in so she can go

## 2017-03-09 NOTE — Telephone Encounter (Signed)
Labs ordered.

## 2017-03-09 NOTE — Telephone Encounter (Signed)
Please advise on labs.

## 2017-03-09 NOTE — Telephone Encounter (Signed)
TSH FT4 BMEt LFT Lipids UA Thx

## 2017-03-21 ENCOUNTER — Other Ambulatory Visit (INDEPENDENT_AMBULATORY_CARE_PROVIDER_SITE_OTHER): Payer: BC Managed Care – PPO

## 2017-03-21 ENCOUNTER — Encounter: Payer: Self-pay | Admitting: Internal Medicine

## 2017-03-21 ENCOUNTER — Ambulatory Visit (INDEPENDENT_AMBULATORY_CARE_PROVIDER_SITE_OTHER): Payer: BC Managed Care – PPO | Admitting: Internal Medicine

## 2017-03-21 VITALS — BP 114/80 | HR 88 | Temp 98.8°F | Ht 63.5 in | Wt 176.0 lb

## 2017-03-21 DIAGNOSIS — M79641 Pain in right hand: Secondary | ICD-10-CM | POA: Insufficient documentation

## 2017-03-21 DIAGNOSIS — Z23 Encounter for immunization: Secondary | ICD-10-CM

## 2017-03-21 DIAGNOSIS — Z0000 Encounter for general adult medical examination without abnormal findings: Secondary | ICD-10-CM

## 2017-03-21 LAB — BASIC METABOLIC PANEL
BUN: 12 mg/dL (ref 6–23)
CALCIUM: 9.9 mg/dL (ref 8.4–10.5)
CO2: 30 meq/L (ref 19–32)
Chloride: 102 mEq/L (ref 96–112)
Creatinine, Ser: 1.17 mg/dL (ref 0.40–1.20)
GFR: 61.69 mL/min (ref >60.00)
GLUCOSE: 91 mg/dL (ref 70–99)
Potassium: 3.7 mEq/L (ref 3.5–5.1)
SODIUM: 141 meq/L (ref 135–145)

## 2017-03-21 LAB — URINALYSIS, ROUTINE W REFLEX MICROSCOPIC
BILIRUBIN URINE: NEGATIVE
Hgb urine dipstick: NEGATIVE
KETONES UR: NEGATIVE
Nitrite: NEGATIVE
PH: 6.5 (ref 5.0–8.0)
RBC / HPF: NONE SEEN (ref 0–?)
Specific Gravity, Urine: <=1.005 — AB (ref 1.000–1.030)
TOTAL PROTEIN, URINE-UPE24: NEGATIVE
URINE GLUCOSE: NEGATIVE
UROBILINOGEN UA: 0.2 (ref 0.0–1.0)

## 2017-03-21 LAB — LIPID PANEL
CHOL/HDL RATIO: 2
Cholesterol: 128 mg/dL (ref 0–200)
HDL: 66.7 mg/dL (ref >39.00)
LDL Cholesterol: 51 mg/dL (ref 0–99)
NONHDL: 61.6
TRIGLYCERIDES: 53 mg/dL (ref 0.0–149.0)
VLDL: 10.6 mg/dL (ref 0.0–40.0)

## 2017-03-21 LAB — HEPATIC FUNCTION PANEL
ALBUMIN: 4 g/dL (ref 3.5–5.2)
ALK PHOS: 96 U/L (ref 39–117)
ALT: 12 U/L (ref 0–35)
AST: 16 U/L (ref 0–37)
Bilirubin, Direct: 0.1 mg/dL (ref 0.0–0.3)
TOTAL PROTEIN: 7.5 g/dL (ref 6.0–8.3)
Total Bilirubin: 0.5 mg/dL (ref 0.2–1.2)

## 2017-03-21 LAB — T4, FREE: Free T4: 1.13 ng/dL (ref 0.60–1.60)

## 2017-03-21 LAB — TSH: TSH: 0.62 u[IU]/mL (ref 0.35–4.50)

## 2017-03-21 MED ORDER — SYNTHROID 75 MCG PO TABS: 75.0000 ug | ORAL_TABLET | Freq: Every day | ORAL | 2 refills | Status: DC

## 2017-03-21 MED ORDER — OLMESARTAN-AMLODIPINE-HCTZ 40-10-25 MG PO TABS: 1.0000 | ORAL_TABLET | Freq: Every day | ORAL | 3 refills | Status: DC

## 2017-03-21 MED ORDER — NAPROXEN 500 MG PO TABS: 500.0000 mg | ORAL_TABLET | Freq: Two times a day (BID) | ORAL | 2 refills | Status: DC | PRN

## 2017-03-21 MED ORDER — POTASSIUM CHLORIDE CRYS ER 20 MEQ PO TBCR: EXTENDED_RELEASE_TABLET | ORAL | 2 refills | Status: DC

## 2017-03-21 NOTE — Assessment & Plan Note (Addendum)
We discussed age appropriate health related issues, including available/recomended screening tests and vaccinations. We discussed a need for adhering to healthy diet and exercise. Labs/EKG were reviewed/ordered. All questions were answered. Colon 2013 - she had a problem with IV sedation "burning" all over. Due in 2023 Shingrix discussed Flu shot

## 2017-03-21 NOTE — Assessment & Plan Note (Signed)
OA and ?CTS Naproxen prn

## 2017-03-21 NOTE — Progress Notes (Signed)
Subjective:  Patient ID: Tina Rivera, female    DOB: 1960/08/17  Age: 56 y.o. MRN: [id number]  CC: No chief complaint on file.   HPI Tina Rivera presents for well exam C/o R hand arthritis  Outpatient Medications Prior to Visit  Medication Sig Dispense Refill  . Cholecalciferol (VITAMIN D3) 1000 UNITS CAPS Take 1,000 Units by mouth daily.     Marland Kitchen KLOR-CON M20 20 MEQ tablet EVERY DAY 30 tablet 5  . naproxen (NAPROSYN) 500 MG tablet Take 1 tablet (500 mg total) by mouth 2 (two) times daily as needed (use pc). 60 tablet 2  . Olmesartan-Amlodipine-HCTZ 40-10-25 MG TABS Take 1 tablet by mouth daily. Patient needs office visit before refills will be given 30 tablet 0  . SYNTHROID 75 MCG tablet Take 1 tablet (75 mcg total) by mouth daily before breakfast. 90 tablet 1  . vitamin E 400 UNIT capsule Take 400 Units by mouth daily.      Marland Kitchen SYNTHROID 75 MCG tablet TAKE 1 TABLET EVERY DAY 30 tablet 11   No facility-administered medications prior to visit.     ROS Review of Systems  Constitutional: Negative for activity change, appetite change, chills, fatigue and unexpected weight change.  HENT: Negative for congestion, mouth sores and sinus pressure.   Eyes: Negative for visual disturbance.  Respiratory: Negative for cough and chest tightness.   Gastrointestinal: Negative for abdominal pain and nausea.  Genitourinary: Negative for difficulty urinating, frequency and vaginal pain.  Musculoskeletal: Positive for arthralgias. Negative for back pain and gait problem.  Skin: Negative for pallor and rash.  Neurological: Negative for dizziness, tremors, weakness, numbness and headaches.  Psychiatric/Behavioral: Negative for confusion and sleep disturbance.    Objective:  BP 114/80 (BP Location: Left Arm, Patient Position: Sitting, Cuff Size: Large)   Pulse 88   Temp 98.8 F (37.1 C) (Oral)   Ht 5' 3.5" (1.613 m)   Wt 176 lb (79.8 kg)   LMP 02/05/2008   SpO2 99%   BMI 30.69 kg/m    BP Readings from Last 3 Encounters:  03/21/17 114/80  09/13/16 108/72  07/21/16 120/75    Wt Readings from Last 3 Encounters:  03/21/17 176 lb (79.8 kg)  09/13/16 177 lb 4 oz (80.4 kg)  07/27/15 171 lb (77.6 kg)    Physical Exam  Constitutional: She appears well-developed. No distress.  HENT:  Head: Normocephalic.  Right Ear: External ear normal.  Left Ear: External ear normal.  Nose: Nose normal.  Mouth/Throat: Oropharynx is clear and moist.  Eyes: Pupils are equal, round, and reactive to light. Conjunctivae are normal. Right eye exhibits no discharge. Left eye exhibits no discharge.  Neck: Normal range of motion. Neck supple. No JVD present. No tracheal deviation present. No thyromegaly present.  Cardiovascular: Normal rate, regular rhythm and normal heart sounds.   Pulmonary/Chest: No stridor. No respiratory distress. She has no wheezes.  Abdominal: Soft. Bowel sounds are normal. She exhibits no distension and no mass. There is no tenderness. There is no rebound and no guarding.  Musculoskeletal: She exhibits no edema or tenderness.  Lymphadenopathy:    She has no cervical adenopathy.  Neurological: She displays normal reflexes. No cranial nerve deficit. She exhibits normal muscle tone. Coordination normal.  Skin: No rash noted. No erythema.  Psychiatric: She has a normal mood and affect. Her behavior is normal. Judgment and thought content normal.    Lab Results  Component Value Date   WBC 6.3 02/17/2015  HGB 11.0 (L) 02/17/2015   HCT 33.0 (L) 02/17/2015   PLT 260.0 02/17/2015   GLUCOSE 86 02/17/2015   CHOL 122 02/17/2015   TRIG 72.0 02/17/2015   HDL 58.00 02/17/2015   LDLCALC 50 02/17/2015   ALT 8 02/17/2015   AST 16 02/17/2015   NA 141 02/17/2015   K 3.5 02/17/2015   CL 103 02/17/2015   CREATININE 1.10 02/17/2015   BUN 13 02/17/2015   CO2 29 02/17/2015   TSH 0.56 09/13/2016    Dg Hip Unilat With Pelvis 2-3 Views Left  Result Date:  09/13/2016 CLINICAL DATA:  Left hip pain with swelling over the last 2 months EXAM: DG HIP (WITH OR WITHOUT PELVIS) 2-3V LEFT COMPARISON:  None. FINDINGS: The left hip joint space is relatively well preserved with minimal acetabular spurring present. No acute fracture is seen. The left pelvic ramus is intact. The SI joints appear corticated. IMPRESSION: Minimal degenerative change of the left hip.  No acute abnormality. Electronically Signed   By: Dwyane Dee M.D.   On: 09/13/2016 09:58    Assessment & Plan:   There are no diagnoses linked to this encounter. I am having Ms. Mcghee maintain her vitamin E, Vitamin D3, naproxen, SYNTHROID, KLOR-CON M20, and Olmesartan-Amlodipine-HCTZ.  No orders of the defined types were placed in this encounter.    Follow-up: No Follow-up on file.  Sonda Primes, MD

## 2017-03-27 ENCOUNTER — Other Ambulatory Visit: Payer: Self-pay | Admitting: Internal Medicine

## 2017-03-27 NOTE — Telephone Encounter (Signed)
rx sent in on 03/21/2017

## 2017-03-29 DIAGNOSIS — Z23 Encounter for immunization: Secondary | ICD-10-CM | POA: Diagnosis not present

## 2017-03-29 NOTE — Addendum Note (Signed)
Addended by: Scarlett Presto on: 03/29/2017 01:46 PM   Modules accepted: Orders

## 2017-12-14 ENCOUNTER — Telehealth: Payer: Self-pay | Admitting: Internal Medicine

## 2017-12-14 NOTE — Telephone Encounter (Signed)
Copied from CRM [id number]. Topic: Quick Communication - See Telephone Encounter >> Dec 14, 2017  4:33 PM Arlyss Gandyichardson, Cacey Willow N, NT wrote: CRM for notification. See Telephone encounter for: 12/14/17. Pt would like to know what OTC medication for her sinuses and allergies she can take that will be safe with the medications she is currently taking.

## 2017-12-15 NOTE — Telephone Encounter (Signed)
Patient called, left VM to speak to her pharmacist about which medication to take OTC for sinuses, call the office if any additional questions.

## 2017-12-26 ENCOUNTER — Other Ambulatory Visit: Payer: Self-pay | Admitting: Internal Medicine

## 2018-01-11 ENCOUNTER — Telehealth: Payer: Self-pay

## 2018-01-11 DIAGNOSIS — Z0000 Encounter for general adult medical examination without abnormal findings: Secondary | ICD-10-CM

## 2018-01-11 NOTE — Telephone Encounter (Signed)
Labs entered, LM notifying pt  Copied from CRM [phone]. Topic: Inquiry >> Jan 02, 2018 11:57 AM Tamela OddiMartin, Don'Quashia, NT wrote: Reason for CRM: patient  called and states she would like her CPE labs done before her Oct appt with Dr. Posey ReaPlotnikov. Please call patient when orders are in CB#[phone]

## 2018-01-26 ENCOUNTER — Other Ambulatory Visit: Payer: Self-pay | Admitting: Internal Medicine

## 2018-02-06 ENCOUNTER — Encounter (HOSPITAL_COMMUNITY): Payer: Self-pay

## 2018-02-06 ENCOUNTER — Emergency Department (HOSPITAL_COMMUNITY): Payer: BC Managed Care – PPO

## 2018-02-06 ENCOUNTER — Observation Stay (HOSPITAL_COMMUNITY)
Admission: EM | Admit: 2018-02-06 | Discharge: 2018-02-08 | Disposition: A | Discharge status: Home / Self Care | Payer: BC Managed Care – PPO | Attending: Internal Medicine | Admitting: Internal Medicine

## 2018-02-06 ENCOUNTER — Other Ambulatory Visit: Payer: Self-pay

## 2018-02-06 DIAGNOSIS — R55 Syncope and collapse: Secondary | ICD-10-CM | POA: Diagnosis not present

## 2018-02-06 DIAGNOSIS — I959 Hypotension, unspecified: Secondary | ICD-10-CM

## 2018-02-06 DIAGNOSIS — E039 Hypothyroidism, unspecified: Secondary | ICD-10-CM | POA: Diagnosis not present

## 2018-02-06 DIAGNOSIS — I10 Essential (primary) hypertension: Secondary | ICD-10-CM | POA: Diagnosis not present

## 2018-02-06 DIAGNOSIS — R001 Bradycardia, unspecified: Secondary | ICD-10-CM | POA: Diagnosis not present

## 2018-02-06 DIAGNOSIS — Z79899 Other long term (current) drug therapy: Secondary | ICD-10-CM | POA: Diagnosis not present

## 2018-02-06 LAB — CBC WITH DIFFERENTIAL/PLATELET
ABS IMMATURE GRANULOCYTES: 0 10*3/uL (ref 0.0–0.1)
BASOS ABS: 0 10*3/uL (ref 0.0–0.1)
Basophils Relative: 1 %
Eosinophils Absolute: 0.1 10*3/uL (ref 0.0–0.7)
Eosinophils Relative: 2 %
HCT: 34.1 % — ABNORMAL LOW (ref 36.0–46.0)
HEMOGLOBIN: 11 g/dL — AB (ref 12.0–15.0)
Immature Granulocytes: 0 %
LYMPHS PCT: 25 %
Lymphs Abs: 1.6 10*3/uL (ref 0.7–4.0)
MCH: 26.4 pg (ref 26.0–34.0)
MCHC: 32.3 g/dL (ref 30.0–36.0)
MCV: 82 fL (ref 78.0–100.0)
MONO ABS: 0.5 10*3/uL (ref 0.1–1.0)
MONOS PCT: 8 %
NEUTROS ABS: 4.2 10*3/uL (ref 1.7–7.7)
Neutrophils Relative %: 64 %
Platelets: 231 10*3/uL (ref 150–400)
RBC: 4.16 MIL/uL (ref 3.87–5.11)
RDW: 14.9 % (ref 11.5–15.5)
WBC: 6.5 10*3/uL (ref 4.0–10.5)

## 2018-02-06 LAB — LIPASE, BLOOD: LIPASE: 31 U/L (ref 11–51)

## 2018-02-06 LAB — COMPREHENSIVE METABOLIC PANEL
ALBUMIN: 3.7 g/dL (ref 3.5–5.0)
ALT: 12 U/L (ref 0–44)
ANION GAP: 9 (ref 5–15)
AST: 23 U/L (ref 15–41)
Alkaline Phosphatase: 83 U/L (ref 38–126)
BUN: 15 mg/dL (ref 6–20)
CO2: 25 mmol/L (ref 22–32)
Calcium: 9.2 mg/dL (ref 8.9–10.3)
Chloride: 105 mmol/L (ref 98–111)
Creatinine, Ser: 1.3 mg/dL — ABNORMAL HIGH (ref 0.44–1.00)
GFR calc Af Amer: 52 mL/min — ABNORMAL LOW (ref >60)
GFR calc non Af Amer: 45 mL/min — ABNORMAL LOW (ref >60)
GLUCOSE: 133 mg/dL — AB (ref 70–99)
POTASSIUM: 3.5 mmol/L (ref 3.5–5.1)
SODIUM: 139 mmol/L (ref 135–145)
Total Bilirubin: 0.5 mg/dL (ref 0.3–1.2)
Total Protein: 6.6 g/dL (ref 6.5–8.1)

## 2018-02-06 LAB — URINALYSIS, ROUTINE W REFLEX MICROSCOPIC
Bilirubin Urine: NEGATIVE
Glucose, UA: NEGATIVE mg/dL
Hgb urine dipstick: NEGATIVE
KETONES UR: NEGATIVE mg/dL
LEUKOCYTES UA: NEGATIVE
NITRITE: NEGATIVE
PH: 5 (ref 5.0–8.0)
PROTEIN: NEGATIVE mg/dL
Specific Gravity, Urine: 1.006 (ref 1.005–1.030)

## 2018-02-06 LAB — TSH: TSH: 0.724 u[IU]/mL (ref 0.350–4.500)

## 2018-02-06 LAB — MAGNESIUM: MAGNESIUM: 1.8 mg/dL (ref 1.7–2.4)

## 2018-02-06 LAB — I-STAT TROPONIN, ED: TROPONIN I, POC: 0 ng/mL (ref 0.00–0.08)

## 2018-02-06 MED ORDER — SODIUM CHLORIDE 0.9 % IV BOLUS
1000.0000 mL | Freq: Once | INTRAVENOUS | Status: AC
  Administered 2018-02-06: 1000 mL via INTRAVENOUS

## 2018-02-06 MED ORDER — SODIUM CHLORIDE 0.9 % IV SOLN
INTRAVENOUS | Status: DC
  Administered 2018-02-06: 23:00:00 via INTRAVENOUS

## 2018-02-06 MED ORDER — LEVOTHYROXINE SODIUM 75 MCG PO TABS
75.0000 ug | ORAL_TABLET | Freq: Every day | ORAL | Status: DC
  Administered 2018-02-07 – 2018-02-08 (×2): 75 ug via ORAL
  Filled 2018-02-06 (×3): qty 1

## 2018-02-06 NOTE — ED Triage Notes (Signed)
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

## 2018-02-06 NOTE — H&P (Signed)
History and Physical    Tina GaleaBeverly L Bunten YSA:[id number] DOB: 12-30-1960 DOA: 02/06/2018  PCP: Tresa GarterPlotnikov, Aleksei V, MD   Patient coming from: Home    Chief Complaint: Lightheadedness, dizziness  HPI: Tina Rivera is a 57 y.o. female with medical history significant of hypertension, hypothyroidism, thyrotoxicosis status post radiation therapy who presented to the emergency department for evaluation of presyncopal episodes.  Patient was apparently all right until yesterday.  Last night she felt lightheaded and dizziness and thought that she is about to pass out.  She slept well.  This morning she could woke up normally.  She went to her work.  Things were  going  well so far but again she became lightheaded and felt dizzy.  She thought she was about to pass out.  Her coworkers said that she actually passed out but she denies it.  She she had poor appetite throughout the whole day today.  Patient said that she had actually passed out few times in the past but they occured when she was exposed to heat/hot day.  She denies any history of cardiovascular disease except for hypertension for which she takes medications regularly.  When EMS arrived she was found to be hypertensive in the range of 88 /60 with a heart rate of 48.  ED Course: Patient was found to be bradycardic in the emergency department.  EKG confirmed sinus bradycardia.  Blood pressure was normal.  Chest x-ray did not show any acute cardiopulmonary disease.  She was given a bolus of normal saline.  Patient seen and examined the bedside in the emergency department.  Feels well.  Denies any dizziness or lightheadedness at the moment.  Wants to go home.  Review of Systems:Patient denies any chest pain, shortness of breath, palpitations, nausea, vomiting, fever, chills, diarrhea or dysuria.   Past Medical History:  Diagnosis Date  . Acute upper respiratory infections of unspecified site   . Allergic rhinitis, cause unspecified   .  Asymptomatic postmenopausal status (age-related) (natural)   . Cough   . Family history of asthma   . Migraine without aura, without mention of intractable migraine without mention of status migrainosus   . Pain in joint, pelvic region and thigh   . Thyrotoxicosis without mention of goiter or other cause, without mention of thyrotoxic crisis or storm   . Unspecified essential hypertension   . Warts     Past Surgical History:  Procedure Laterality Date  . CESAREAN SECTION  [id number]   x2     reports that she has never smoked. She has never used smokeless tobacco. She reports that she does not drink alcohol or use drugs.  Allergies  Allergen Reactions  . Codeine Hives and Nausea Only  . Propofol     Burning all over feeling and IV pain during colonoscopy sedation (before she went out)    Family History  Problem Relation Age of Onset  . Asthma Mother   . Asthma Sister   . Hypertension Other   . Colon cancer Neg Hx   . Diabetes Neg Hx   . Rectal cancer Neg Hx   . Stomach cancer Neg Hx      Prior to Admission medications   Medication Sig Start Date End Date Taking? Authorizing Provider  acetaminophen (TYLENOL) 500 MG tablet Take 1,000 mg by mouth as needed for mild pain or headache.   Yes [provider]  Cholecalciferol (VITAMIN D3) 1000 UNITS CAPS Take 1,000 Units by mouth daily.  Yes [provider]  ketotifen (ALAWAY) 0.025 % ophthalmic solution Place 1 drop into both eyes as needed (itchy eyes).   Yes [provider]  naproxen (NAPROSYN) 500 MG tablet Take 1 tablet (500 mg total) by mouth 2 (two) times daily as needed (use pc). 03/21/17  Yes Plotnikov, Georgina QuintAleksei V, MD  Olmesartan-Amlodipine-HCTZ 40-10-25 MG TABS Take 1 tablet by mouth daily. Patient needs office visit before refills will be given 03/21/17  Yes Plotnikov, Georgina QuintAleksei V, MD  potassium chloride SA (KLOR-CON M20) 20 MEQ tablet TAKE 1 TABLET BY MOUTH EVERY DAY 01/28/18  Yes Plotnikov,  Georgina QuintAleksei V, MD  SYNTHROID 75 MCG tablet TAKE 1 TABLET BY MOUTH DAILY BEFORE BREAKFAST. Patient taking differently: Take 75 mcg by mouth daily before breakfast.  12/26/17  Yes Plotnikov, Georgina QuintAleksei V, MD  vitamin E 400 UNIT capsule Take 400 Units by mouth daily.     Yes [provider]    Physical Exam: Vitals:   02/06/18 1515 02/06/18 1530 02/06/18 1545 02/06/18 1610  BP: 99/65   107/66  Pulse: (!) 51  (!) 58 (!) 52  Resp: 18 (!) 24 16 (!) 22  Temp:      TempSrc:      SpO2: 98%  98% 99%  Weight:      Height:        Constitutional: NAD, calm, comfortable Vitals:   02/06/18 1515 02/06/18 1530 02/06/18 1545 02/06/18 1610  BP: 99/65   107/66  Pulse: (!) 51  (!) 58 (!) 52  Resp: 18 (!) 24 16 (!) 22  Temp:      TempSrc:      SpO2: 98%  98% 99%  Weight:      Height:       Eyes: PERRL, lids and conjunctivae normal ENMT: Mucous membranes are moist. Posterior pharynx clear of any exudate or lesions.Normal dentition.  Neck: normal, supple, no masses, no thyromegaly Respiratory: clear to auscultation bilaterally, no wheezing, no crackles. Normal respiratory effort. No accessory muscle use.  Cardiovascular: Regular rate and rhythm, no murmurs / rubs / gallops. No extremity edema. 2+ pedal pulses. No carotid bruits.  Abdomen: no tenderness, no masses palpated. No hepatosplenomegaly. Bowel sounds positive.  Musculoskeletal: no clubbing / cyanosis. No joint deformity upper and lower extremities. Good ROM, no contractures. Normal muscle tone.  Skin: no rashes, lesions, ulcers. No induration Neurologic: CN 2-12 grossly intact. Sensation intact, DTR normal. Strength 5/5 in all 4.  Psychiatric: Normal judgment and insight. Alert and oriented x 3. Normal mood.   Foley Catheter:None  Labs on Admission: I have personally reviewed following labs and imaging studies  CBC: Recent Labs  Lab 02/06/18 1221  WBC 6.5  NEUTROABS 4.2  HGB 11.0*  HCT 34.1*  MCV 82.0  PLT 231   Basic  Metabolic Panel: Recent Labs  Lab 02/06/18 1221 02/06/18 1428  NA 139  --   K 3.5  --   CL 105  --   CO2 25  --   GLUCOSE 133*  --   BUN 15  --   CREATININE 1.30*  --   CALCIUM 9.2  --   MG  --  1.8   GFR: Estimated Creatinine Clearance: 48.7 mL/min (A) (by C-G formula based on SCr of 1.3 mg/dL (H)). Liver Function Tests: Recent Labs  Lab 02/06/18 1221  AST 23  ALT 12  ALKPHOS 83  BILITOT 0.5  PROT 6.6  ALBUMIN 3.7   Recent Labs  Lab 02/06/18 1221  LIPASE  31   No results for input(s): AMMONIA in the last 168 hours. Coagulation Profile: No results for input(s): INR, PROTIME in the last 168 hours. Cardiac Enzymes: No results for input(s): CKTOTAL, CKMB, CKMBINDEX, TROPONINI in the last 168 hours. BNP (last 3 results) No results for input(s): PROBNP in the last 8760 hours. HbA1C: No results for input(s): HGBA1C in the last 72 hours. CBG: No results for input(s): GLUCAP in the last 168 hours. Lipid Profile: No results for input(s): CHOL, HDL, LDLCALC, TRIG, CHOLHDL, LDLDIRECT in the last 72 hours. Thyroid Function Tests: Recent Labs    02/06/18 1449  TSH 0.724   Anemia Panel: No results for input(s): VITAMINB12, FOLATE, FERRITIN, TIBC, IRON, RETICCTPCT in the last 72 hours. Urine analysis:    Component Value Date/Time   COLORURINE STRAW (A) 02/06/2018 1602   APPEARANCEUR CLEAR 02/06/2018 1602   LABSPEC 1.006 02/06/2018 1602   PHURINE 5.0 02/06/2018 1602   GLUCOSEU NEGATIVE 02/06/2018 1602   GLUCOSEU NEGATIVE 03/21/2017 0901   HGBUR NEGATIVE 02/06/2018 1602   BILIRUBINUR NEGATIVE 02/06/2018 1602   KETONESUR NEGATIVE 02/06/2018 1602   PROTEINUR NEGATIVE 02/06/2018 1602   UROBILINOGEN 0.2 03/21/2017 0901   NITRITE NEGATIVE 02/06/2018 1602   LEUKOCYTESUR NEGATIVE 02/06/2018 1602    Radiological Exams on Admission: Dg Chest 2 View  Result Date: 02/06/2018 CLINICAL DATA:  Syncopal episode today. EXAM: CHEST - 2 VIEW COMPARISON:  PA and lateral chest  07/20/2010 and 06/07/2008. FINDINGS: The lungs are clear. Heart size is normal. No pneumothorax or pleural effusion. No acute or focal bony abnormality. IMPRESSION: Negative chest. Electronically Signed   By: Drusilla Kanner M.D.   On: 02/06/2018 13:53     Assessment/Plan Principal Problem:   Bradycardia Active Problems:   Hypothyroidism   Essential hypertension   Near syncope  Bradycardia: Symptomatic sinus bradycardia.  Associated with lightheadedness and dizziness.  Also noted to be hypotensive when EMS arrived. Cardiology is evaluating the patient.  EKG just showed sinus bradycardia but did not show any bundle branch blocks.  Will check echocardiogram.  Lightheadedness/dizziness/near syncope: Most likely associated with bradycardia.  Will check orthostatic vitals.  Hypothyroidism: Continue Synthyroid.  TSH is normal.  History of thyrotoxicosis status post radiation therapy.  Hypertension: Currently blood pressure is soft.  Will hold hold her home medications.  Patient was on olmesartan-amlodipine/hydrochlorothiazide combination.    Severity of Illness: The appropriate patient status for this patient is OBSERVATION.    DVT prophylaxis: None.  Patient  is ambulatory Code Status: Full Family Communication: Daughter present at the bedside Consults called: Cardiology     Burnadette Pop MD [facility] Pager [phone]  If 7PM-7AM, please contact night-coverage [email or web address] Password [facility]  02/06/2018, 4:58 PM

## 2018-02-06 NOTE — ED Provider Notes (Signed)
[facility] Provider Note   CSN: [id number] Arrival date & time: 02/06/18  1209     History   Chief Complaint Chief Complaint  Patient presents with  . Loss of Consciousness    HPI Tina Rivera is a 57 y.o. female.  Tina Rivera is a 57 y.o. Female with a history of thyroid disease, hypertension, migraines and seasonal allergies, who presents to the emergency department for evaluation after syncopal episode.  Patient reports she has been feeling a bit off since last night, she was a bit nauseated, and today at work she started to feel lightheaded, as and if her hearing and vision were muffled, went to go tell a coworker, when she had a syncopal episode, she did not fall to the ground she was lowered to the floor by a coworker.  Patient denies any preceding chest pain or shortness of breath.  Initially EMS found her to be hypotensive with BP of 88/60 and a heart rate of 48, but this improved without intervention.  Patient reports that she has had multiple similar episodes, last complete syncopal episode was in June but she frequently feels lightheaded, and this is not just with position changes but even when she is sitting at rest.  Patient has not sought evaluation for this yet.  Reports her heart rate is usually in the 70s or 80s, and this bradycardia is new.      Past Medical History:  Diagnosis Date  . Acute upper respiratory infections of unspecified site   . Allergic rhinitis, cause unspecified   . Asymptomatic postmenopausal status (age-related) (natural)   . Cough   . Family history of asthma   . Migraine without aura, without mention of intractable migraine without mention of status migrainosus   . Pain in joint, pelvic region and thigh   . Thyrotoxicosis without mention of goiter or other cause, without mention of thyrotoxic crisis or storm   . Unspecified essential hypertension   . Warts     Patient Active Problem List   Diagnosis Date Noted  . Hand pain, right 03/21/2017  . Hip pain 09/13/2016  . Hot flash not due to menopause 07/27/2015  . Well adult exam 02/05/2012  . Heartburn 12/12/2010  . Alopecia 12/12/2010  . INSOMNIA, CHRONIC 08/26/2010  . Rosacea 08/26/2010  . SHOULDER PAIN 08/26/2010  . 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 GRAVES DISEASE 05/01/2008  . Hypothyroidism 05/01/2008  . ASYMPTOMATIC POSTMENOPAUSAL STATUS 02/28/2008  . COUGH 12/09/2007  . COMMON MIGRAINE 09/21/2007  . Essential hypertension 05/23/2007  . UPPER RESPIRATORY INFECTION (URI) 05/23/2007  . ALLERGIC RHINITIS 05/23/2007  . HIP PAIN 05/23/2007    Past Surgical History:  Procedure Laterality Date  . CESAREAN SECTION  [id number]   x2     OB History   None      Home Medications    Prior to Admission medications   Medication Sig Start Date End Date Taking? Authorizing Provider  acetaminophen (TYLENOL) 500 MG tablet Take 1,000 mg by mouth as needed for mild pain or headache.   Yes [provider]  Cholecalciferol (VITAMIN D3) 1000 UNITS CAPS Take 1,000 Units by mouth daily.    Yes [provider]  ketotifen (ALAWAY) 0.025 % ophthalmic solution Place 1 drop into both eyes as needed (itchy eyes).   Yes [provider]  naproxen (NAPROSYN) 500 MG tablet Take 1 tablet (500 mg total) by mouth 2 (two) times daily as needed (use pc). 03/21/17  Yes Plotnikov, Georgina QuintAleksei V, MD  Olmesartan-Amlodipine-HCTZ 40-10-25 MG TABS Take 1 tablet by mouth daily. Patient needs office visit before refills will be given 03/21/17  Yes Plotnikov, Georgina QuintAleksei V, MD  potassium chloride SA (KLOR-CON M20) 20 MEQ tablet TAKE 1 TABLET BY MOUTH EVERY DAY 01/28/18  Yes Plotnikov, Georgina QuintAleksei V, MD  SYNTHROID 75 MCG tablet TAKE 1 TABLET BY MOUTH DAILY BEFORE BREAKFAST. Patient taking differently: Take 75 mcg by mouth daily before breakfast.  12/26/17  Yes Plotnikov, Georgina QuintAleksei V, MD  vitamin E 400 UNIT capsule Take 400 Units by mouth  daily.     Yes [provider]    Family History Family History  Problem Relation Age of Onset  . Asthma Mother   . Asthma Sister   . Hypertension Other   . Colon cancer Neg Hx   . Diabetes Neg Hx   . Rectal cancer Neg Hx   . Stomach cancer Neg Hx     Social History Social History   Tobacco Use  . Smoking status: Never Smoker  . Smokeless tobacco: Never Used  Substance Use Topics  . Alcohol use: No  . Drug use: No     Allergies   Codeine and Propofol   Review of Systems Review of Systems  Constitutional: Negative for chills and fever.  HENT: Negative for congestion, rhinorrhea and sore throat.   Eyes: Negative for visual disturbance.  Respiratory: Negative for cough, chest tightness, shortness of breath and wheezing.   Cardiovascular: Negative for chest pain, palpitations and leg swelling.  Gastrointestinal: Positive for nausea. Negative for abdominal pain, diarrhea and vomiting.  Genitourinary: Negative for dysuria and frequency.  Musculoskeletal: Negative for arthralgias, back pain, myalgias and neck pain.  Skin: Negative for color change and rash.  Neurological: Positive for dizziness, syncope and light-headedness. Negative for speech difficulty, weakness, numbness and headaches.     Physical Exam Updated Vital Signs BP 108/62 (BP Location: Right Arm)   Pulse (!) 50   Temp (!) 97.2 F (36.2 C) (Oral)   Resp 18   LMP 02/05/2008   SpO2 100%   Physical Exam  Constitutional: She is oriented to person, place, and time. She appears well-developed and well-nourished. No distress.  HENT:  Head: Normocephalic and atraumatic.  Mouth/Throat: Oropharynx is clear and moist.  Eyes: Pupils are equal, round, and reactive to light. EOM are normal. Right eye exhibits no discharge. Left eye exhibits no discharge.  Neck: Normal range of motion. Neck supple. No JVD present. No tracheal deviation present.  Cardiovascular: Regular rhythm, normal heart sounds and  intact distal pulses. Exam reveals no gallop and no friction rub.  No murmur heard. Bradycardia  Pulmonary/Chest: Effort normal and breath sounds normal. No respiratory distress.  Respirations equal and unlabored, patient able to speak in full sentences, lungs clear to auscultation bilaterally  Abdominal: Soft. Bowel sounds are normal. She exhibits no distension and no mass. There is no tenderness. There is no guarding.  Abdomen soft, nondistended, nontender to palpation in all quadrants without guarding or peritoneal signs  Musculoskeletal: She exhibits no edema or deformity.  Bilateral lower extremities without edema or tenderness  Neurological: She is alert and oriented to person, place, and time. Coordination normal.  Speech is clear, able to follow commands CN III-XII intact Normal strength in upper and lower extremities bilaterally including dorsiflexion and plantar flexion, strong and equal grip strength Sensation normal to light and sharp touch Moves extremities without ataxia, coordination intact Normal finger to nose and rapid  alternating movements No pronator drift  Skin: Skin is warm and dry. Capillary refill takes less than 2 seconds. She is not diaphoretic.  Psychiatric: She has a normal mood and affect. Her behavior is normal.  Nursing note and vitals reviewed.    ED Treatments / Results  Labs (all labs ordered are listed, but only abnormal results are displayed) Labs Reviewed  COMPREHENSIVE METABOLIC PANEL - Abnormal; Notable for the following components:      Result Value   Glucose, Bld 133 (*)    Creatinine, Ser 1.30 (*)    GFR calc non Af Amer 45 (*)    GFR calc Af Amer 52 (*)    All other components within normal limits  CBC WITH DIFFERENTIAL/PLATELET - Abnormal; Notable for the following components:   Hemoglobin 11.0 (*)    HCT 34.1 (*)    All other components within normal limits  URINALYSIS, ROUTINE W REFLEX MICROSCOPIC - Abnormal; Notable for the  following components:   Color, Urine STRAW (*)    All other components within normal limits  LIPASE, BLOOD  TSH  MAGNESIUM  HIV ANTIBODY (ROUTINE TESTING)  BASIC METABOLIC PANEL  I-STAT TROPONIN, ED    EKG EKG Interpretation  Date/Time:  Wednesday February 06 2018 12:11:43 EDT Ventricular Rate:  49 PR Interval:    QRS Duration: 89 QT Interval:  424 QTC Calculation: 383 R Axis:   70 Text Interpretation:  Sinus bradycardia Borderline prolonged PR interval Minimal ST elevation, inferior leads When compared to prior, slower rate.  No STEMI Confirmed by Theda Belfast ([id number]) on 02/06/2018 1:24:49 PM   Radiology Dg Chest 2 View  Result Date: 02/06/2018 CLINICAL DATA:  Syncopal episode today. EXAM: CHEST - 2 VIEW COMPARISON:  PA and lateral chest 07/20/2010 and 06/07/2008. FINDINGS: The lungs are clear. Heart size is normal. No pneumothorax or pleural effusion. No acute or focal bony abnormality. IMPRESSION: Negative chest. Electronically Signed   By: Drusilla Kanner M.D.   On: 02/06/2018 13:53    Procedures Procedures (including critical care time)  Medications Ordered in ED Medications  sodium chloride 0.9 % bolus 1,000 mL (0 mLs Intravenous Stopped 02/06/18 1506)     Initial Impression / Assessment and Plan / ED Course  I have reviewed the triage vital signs and the nursing notes.  Pertinent labs & imaging results that were available during my care of the patient were reviewed by me and considered in my medical decision making (see chart for details).  Patient presents via EMS for evaluation after syncopal episode.  Patient found to be bradycardic at 43 and hypotensive at 88/60 on scene, blood pressure has improved slightly but remains soft.  Patient reports similar previous syncopal episode in June for which she did not seek evaluation and she has had multiple instances where she is felt lightheaded as if she might pass out while seated at rest.  Patient felt nauseated with  muffled hearing and tunnel vision just prior to passing out, but did not have prodromal chest pain or shortness of breath.  Reports she is currently feeling slightly lightheaded but is otherwise asymptomatic.  Prior history of cardiac issues.  No known history of arrhythmias.  On exam patient continues to be bradycardic with heart rate in the 40s and 50s, but regular rhythm.  Lungs clear, abdomen benign and normal neurologic exam.  Will check basic labs, EKG, troponin and chest x-ray, as well as urinalysis, will also check magnesium as well as TSH, history of thyrotoxicosis, reports  she is on chronic thyroid medications which she has been compliant with.  Labs overall reassuring, no leukocytosis, stable hemoglobin, no acute electrolyte derangements, creatinine is 1.30, slightly elevated from baseline, likely in the setting of dehydration.  IV fluid bolus provided.  Normal liver function and lipase.  Urinalysis without signs of infection.  Initial troponin negative and EKG unremarkable aside from bradycardia.  Magnesium within normal limits as well as TSH.  Chest x-ray shows no active cardiopulmonary disease.  Despite reassuring work-up, syncope story is concerning especially with new onset and persistent bradycardia, previous resting heart rate is in the 70s-80s on chart review.  Patient would benefit from observational admission for continued telemetry and possible echocardiogram.   Case discussed with Dr. Anthoney Harada with [facility] hospital[facility] who will see and admit the patient.  Requests cardiology consult. Discussed with Dr. [place] with cardiology who will see and evaluate the patient as well.  Case discussed with Dr. Rush Landmark who helped guide care and is in agreement with plan.  Final Clinical Impressions(s) / ED Diagnoses   Final diagnoses:  Syncope and collapse  Bradycardia  Hypotension, unspecified hypotension type    ED Discharge Orders    None       Dartha Lodge, [place] 02/06/18 1723      Tegeler, Canary Brim, MD 02/06/18 1949

## 2018-02-06 NOTE — ED Notes (Signed)
Pt provided w/ food and beverage per PA

## 2018-02-06 NOTE — Consult Note (Addendum)
Cardiology Consultation:   Patient ID: Tina Rivera; [id number]; September 20, 1960   Admit date: 02/06/2018 Date of Consult: 02/06/2018  Primary Care Provider: Tresa Garter, MD Primary Cardiologist: Olga Millers, MD New Primary Electrophysiologist:  NA   Patient Profile:   Tina Rivera is a 57 y.o. female with a hx of HTN, thyroid toxicosis now on levothyroxine who is being seen today for the evaluation of syncope at the request of Dr. Rush Landmark.  History of Present Illness:   Tina Rivera was in her usual state of health until last evening and she had room spinning dizziness and abd discomfort.  She went to bed and woke feeling well today.  She went to work and while sitting room spinning returned and abd discomfort returned.  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  They finally helped her to sit.  She had splotchy visual changes.  She felt very bad.  EMS called - she stated she never passed out.  No chest pain, no SOB.    BP per EMS 88/60 and HR 48 SR  HR in ambulance per pt was to 30s.  Glucose was good and sp02 was 99%.  She was given IV fluids.    In June she had similar episode with room spinning dizziness.  At that time she had chest cold as well.  She denies syncope.  She has hx of syncope if she does not sleep well for few days.  She has of migraines but last one was 8-10 years ago  She tells me she has been resting well, eating and drinking fluids appropriately.  No colds or fevers.    Currently she feels fine.  HR in 60s now.    EKG SB at 49 PR 240 ms I personally reviewed. Previous EKG 2014 with HR 66 and 1st degree AV block of 220 ms  Na 139 K+ 3.5 Cr 1.30 Mg+ 1.8 LFTs normal Troponin poc 0.00 Hgb 11 HCT 34 and plts 231  CXR without acute process.  Past Medical History:  Diagnosis Date  . Acute upper respiratory infections of  unspecified site   . Allergic rhinitis, cause unspecified   . Asymptomatic postmenopausal status (age-related) (natural)   . Cough   . Family history of asthma   . Migraine without aura, without mention of intractable migraine without mention of status migrainosus   . Pain in joint, pelvic region and thigh   . Thyrotoxicosis without mention of goiter or other cause, without mention of thyrotoxic crisis or storm   . Unspecified essential hypertension   . Warts     Past Surgical History:  Procedure Laterality Date  . CESAREAN SECTION  4098,1191   x2     Home Medications:  Prior to Admission medications   Medication Sig Start Date End Date Taking? Authorizing Provider  acetaminophen (TYLENOL) 500 MG tablet Take 1,000 mg by mouth as needed for mild pain or headache.   Yes [provider]  Cholecalciferol (VITAMIN D3) 1000 UNITS CAPS Take 1,000 Units by mouth daily.    Yes [provider]  ketotifen (ALAWAY) 0.025 % ophthalmic solution Place 1 drop into both eyes as needed (itchy eyes).   Yes [provider]  naproxen (NAPROSYN) 500 MG tablet Take 1 tablet (500 mg total) by mouth 2 (two) times daily as needed (use  pc). 03/21/17  Yes Plotnikov, Georgina Quint, MD  Olmesartan-Amlodipine-HCTZ 40-10-25 MG TABS Take 1 tablet by mouth daily. Patient needs office visit before refills will be given 03/21/17  Yes Plotnikov, Georgina Quint, MD  potassium chloride SA (KLOR-CON M20) 20 MEQ tablet TAKE 1 TABLET BY MOUTH EVERY DAY 01/28/18  Yes Plotnikov, Georgina Quint, MD  SYNTHROID 75 MCG tablet TAKE 1 TABLET BY MOUTH DAILY BEFORE BREAKFAST. Patient taking differently: Take 75 mcg by mouth daily before breakfast.  12/26/17  Yes Plotnikov, Georgina Quint, MD  vitamin E 400 UNIT capsule Take 400 Units by mouth daily.     Yes [provider]    Inpatient Medications: Scheduled Meds:  Continuous Infusions:  PRN Meds:   Allergies:    Allergies  Allergen Reactions  . Codeine Hives  and Nausea Only  . Propofol     Burning all over feeling and IV pain during colonoscopy sedation (before she went out)    Social History:   Social History   Socioeconomic History  . Marital status: Married    Spouse name: Not on file  . Number of children: Not on file  . Years of education: Not on file  . Highest education level: Not on file  Occupational History  . Occupation: Dentist  Social Needs  . Financial resource strain: Not on file  . Food insecurity:    Worry: Not on file    Inability: Not on file  . Transportation needs:    Medical: Not on file    Non-medical: Not on file  Tobacco Use  . Smoking status: Never Smoker  . Smokeless tobacco: Never Used  Substance and Sexual Activity  . Alcohol use: No  . Drug use: No  . Sexual activity: Yes  Lifestyle  . Physical activity:    Days per week: Not on file    Minutes per session: Not on file  . Stress: Not on file  Relationships  . Social connections:    Talks on phone: Not on file    Gets together: Not on file    Attends religious service: Not on file    Active member of club or organization: Not on file    Attends meetings of clubs or organizations: Not on file    Relationship status: Not on file  . Intimate partner violence:    Fear of current or ex partner: Not on file    Emotionally abused: Not on file    Physically abused: Not on file    Forced sexual activity: Not on file  Other Topics Concern  . Not on file  Social History Narrative   Does drink fair amount of caffeine    Family History:    Family History  Problem Relation Age of Onset  . Asthma Mother   . Asthma Sister   . Hypertension Other   . Colon cancer Neg Hx   . Diabetes Neg Hx   . Rectal cancer Neg Hx   . Stomach cancer Neg Hx      ROS:  Please see the history of present illness.  General:no colds or fevers, no weight changes Skin:no rashes or ulcers HEENT:no blurred vision, no congestion CV:see HPI PUL:see HPI GI:no  diarrhea constipation or melena, no indigestion GU:no hematuria, no dysuria MS:no joint pain, no claudication Neuro:no syncope, no lightheadedness Endo:no diabetes, + thyroid disease  All other ROS reviewed and negative.     Physical Exam/Data:   Vitals:   02/06/18 1515 02/06/18 1530 02/06/18  1545 02/06/18 1610  BP: 99/65   107/66  Pulse: (!) 51  (!) 58 (!) 52  Resp: 18 (!) 24 16 (!) 22  Temp:      TempSrc:      SpO2: 98%  98% 99%  Weight:      Height:        Intake/Output Summary (Last 24 hours) at 02/06/2018 1651 Last data filed at 02/06/2018 1506 Gross per 24 hour  Intake 1000 ml  Output -  Net 1000 ml   Filed Weights   02/06/18 1221  Weight: 79.4 kg   Body mass index is 30.51 kg/m.  General:  Well nourished, well developed, in no acute distress feels back to normal. HEENT: normal Lymph: no adenopathy Neck: no JVD Endocrine:  No thryomegaly Vascular: No carotid bruits; pedal pulses 2+ bilaterally   Cardiac:  normal S1, S2; RRR; no murmur, gallup rub or click  Lungs:  clear to auscultation bilaterally, no wheezing, rhonchi or rales  Abd: soft, nontender, no hepatomegaly  Ext: no edema Musculoskeletal:  No deformities, BUE and BLE strength normal and equal Skin: warm and dry  Neuro:  Alert and oriented X 3 MAE follows commands, + facial symmetry, tongue midline,  no focal abnormalities noted Psych:  Normal affect    Relevant CV Studies: none  Laboratory Data:  Chemistry Recent Labs  Lab 02/06/18 1221  NA 139  K 3.5  CL 105  CO2 25  GLUCOSE 133*  BUN 15  CREATININE 1.30*  CALCIUM 9.2  GFRNONAA 45*  GFRAA 52*  ANIONGAP 9    Recent Labs  Lab 02/06/18 1221  PROT 6.6  ALBUMIN 3.7  AST 23  ALT 12  ALKPHOS 83  BILITOT 0.5   Hematology Recent Labs  Lab 02/06/18 1221  WBC 6.5  RBC 4.16  HGB 11.0*  HCT 34.1*  MCV 82.0  MCH 26.4  MCHC 32.3  RDW 14.9  PLT 231   Cardiac EnzymesNo results for input(s): TROPONINI in the last 168 hours.    Recent Labs  Lab 02/06/18 1255  TROPIPOC 0.00    BNPNo results for input(s): BNP, PROBNP in the last 168 hours.  DDimer No results for input(s): DDIMER in the last 168 hours.  Radiology/Studies:  Dg Chest 2 View  Result Date: 02/06/2018 CLINICAL DATA:  Syncopal episode today. EXAM: CHEST - 2 VIEW COMPARISON:  PA and lateral chest 07/20/2010 and 06/07/2008. FINDINGS: The lungs are clear. Heart size is normal. No pneumothorax or pleural effusion. No acute or focal bony abnormality. IMPRESSION: Negative chest. Electronically Signed   By: Drusilla Kanner M.D.   On: 02/06/2018 13:53    Assessment and Plan:   1. Syncope vs near syncope.  She does have hx of syncope in past as well.  She was hypotensive with EMS arrival and SB down to 30s,  May be due to arrhythmia but could be neuro as well possible seizure.  She most likely will need 30 day event monitor and echo.  TSH is normal. BP is normal.  Not really orthostatic  2. Hx of syncope usually due to lack of sleep. 3. Hypothyroid  4. Hx of Migraine last one 8-10 yrs ago.    For questions or updates, please contact [facility] Please consult [email or web address] for contact info under Cardiology/STEMI.   Signed, Nada Boozer, NP  02/06/2018 4:51 PM   As above, patient seen and examined.  Briefly she is a 57 year old female with past medical history of hypertension, prior hyperthyroidism  now on levothyroxine for evaluation of syncope.  Patient states she has a long history of syncopal episodes dating back to age 27.  These typically occur when she is "hot" or when she has not had enough sleep and is having nausea.  She typically feels nauseated and mildly diaphoretic.  Some episodes have occurred after vomiting.  Last evening she complained of dizziness not associated with chest pain, palpitations or dyspnea.  When she awoke this morning she felt better but when she went to work she again developed dizziness and general malaise.  There was nausea  as well.  Her coworkers were talking to her but she could not answer though she was aware of what was going on.  EMS was therefore called.  Initial blood pressure 88/60 and heart rate 48.  Patient was treated with IV fluids with improvement.  Cardiology now asked to evaluate.  Electrocardiogram shows sinus bradycardia with nonspecific ST changes.  Initial troponin normal.  Creatinine 1.30.  Hemoglobin 11.  1 dizziness/near syncope-patient has a long history of syncopal episodes that sound likely vagally mediated.  Typically associated with being "hot" and diaphoretic and also associated with nausea.  Episode today of unclear etiology.  However she did not have frank syncope.  Would watch on telemetry.  Check echocardiogram tomorrow for LV function.  If LV function normal and no significant arrhythmia could pursue outpatient event monitor.  Note she was mildly bradycardic but unclear if this caused presenting symptoms.  Note given probable history of vasovagal syncope we discussed the importance of maintaining hydration and sodium intake.  2 hypertension-blood pressure appears to be controlled.  Continue present medications and follow.  3 hypothyroid-management per primary care.  Olga Millers, MD

## 2018-02-07 ENCOUNTER — Other Ambulatory Visit: Payer: Self-pay | Admitting: Physician Assistant

## 2018-02-07 ENCOUNTER — Other Ambulatory Visit: Payer: Self-pay | Admitting: Medical

## 2018-02-07 ENCOUNTER — Other Ambulatory Visit (HOSPITAL_COMMUNITY): Payer: BC Managed Care – PPO

## 2018-02-07 ENCOUNTER — Telehealth: Payer: Self-pay | Admitting: Internal Medicine

## 2018-02-07 ENCOUNTER — Ambulatory Visit (HOSPITAL_COMMUNITY): Payer: BC Managed Care – PPO

## 2018-02-07 DIAGNOSIS — I10 Essential (primary) hypertension: Secondary | ICD-10-CM

## 2018-02-07 DIAGNOSIS — Z79899 Other long term (current) drug therapy: Secondary | ICD-10-CM | POA: Diagnosis not present

## 2018-02-07 DIAGNOSIS — R55 Syncope and collapse: Secondary | ICD-10-CM

## 2018-02-07 DIAGNOSIS — R001 Bradycardia, unspecified: Secondary | ICD-10-CM | POA: Diagnosis not present

## 2018-02-07 DIAGNOSIS — E039 Hypothyroidism, unspecified: Secondary | ICD-10-CM | POA: Diagnosis not present

## 2018-02-07 LAB — BASIC METABOLIC PANEL
ANION GAP: 10 (ref 5–15)
BUN: 11 mg/dL (ref 6–20)
CHLORIDE: 107 mmol/L (ref 98–111)
CO2: 27 mmol/L (ref 22–32)
Calcium: 9 mg/dL (ref 8.9–10.3)
Creatinine, Ser: 1.29 mg/dL — ABNORMAL HIGH (ref 0.44–1.00)
GFR calc non Af Amer: 45 mL/min — ABNORMAL LOW (ref >60)
GFR, EST AFRICAN AMERICAN: 53 mL/min — AB (ref >60)
Glucose, Bld: 101 mg/dL — ABNORMAL HIGH (ref 70–99)
POTASSIUM: 3.3 mmol/L — AB (ref 3.5–5.1)
Sodium: 144 mmol/L (ref 135–145)

## 2018-02-07 LAB — HIV ANTIBODY (ROUTINE TESTING W REFLEX): HIV Screen 4th Generation wRfx: NONREACTIVE

## 2018-02-07 MED ORDER — POTASSIUM CHLORIDE CRYS ER 20 MEQ PO TBCR
40.0000 meq | EXTENDED_RELEASE_TABLET | Freq: Once | ORAL | Status: AC
  Administered 2018-02-07: 40 meq via ORAL
  Filled 2018-02-07: qty 2

## 2018-02-07 MED ORDER — NITROGLYCERIN 0.4 MG SL SUBL
0.4000 mg | SUBLINGUAL_TABLET | SUBLINGUAL | Status: DC | PRN
  Administered 2018-02-07: 0.4 mg via SUBLINGUAL

## 2018-02-07 MED ORDER — NITROGLYCERIN 0.4 MG SL SUBL
SUBLINGUAL_TABLET | SUBLINGUAL | Status: AC
  Filled 2018-02-07: qty 1

## 2018-02-07 NOTE — Progress Notes (Signed)
Contacted by nursing staff, the echocardiogram is not able to be done today, would have to be done tomorrow.  Contacted Dr. Crenshaw with this news, heJens Som states it is okay to have the echo done as an outpatient.  Nursing staff aware, they will contact IM for discharge.  Theodore DemarkRhonda Teira Arcilla, PA-C 02/07/2018 6:33 PM Beeper [phone]

## 2018-02-07 NOTE — Telephone Encounter (Signed)
Noted.  I am aware. Thank you

## 2018-02-07 NOTE — Discharge Summary (Addendum)
Physician Discharge Summary  Tina Rivera:[id number] DOB: [birth year] DOA: 02/06/2018  PCP: Tresa Garter, MD  Admit date: 02/06/2018 Discharge date: 02/08/2018  Admitted From: Home Disposition:  Home  Discharge Condition:Stable CODE STATUS:FULL Diet recommendation: Heart Healthy   Brief/Interim Summary: HPI: Tina Rivera is a 57 y.o. female with medical history significant of hypertension, hypothyroidism, thyrotoxicosis status post radiation therapy who presented to the emergency department for evaluation of presyncopal episodes.  Patient was apparently all right until yesterday.  Last night she felt lightheaded and dizziness and thought that she is about to pass out.  She slept well.  This morning she could woke up normally.  She went to her work.  Things were  going  well so far but again she became lightheaded and felt dizzy.  She thought she was about to pass out.  Her coworkers said that she actually passed out but she denies it.  She she had poor appetite throughout the whole day today.  Patient said that she had actually passed out few times in the past but they occured when she was exposed to heat/hot day.  She denies any history of cardiovascular disease except for hypertension for which she takes medications regularly.  When EMS arrived she was found to be hypertensive in the range of 88 /60 with a heart rate of 48.  ED Course: Patient was found to be bradycardic in the emergency department.  EKG confirmed sinus bradycardia.  Blood pressure was normal.  Chest x-ray did not show any acute cardiopulmonary disease.  She was given a bolus of normal saline.  Patient seen and examined the bedside in the emergency department.  Feels well.  Denies any dizziness or lightheadedness at the moment.  Wants to go home.  Hospital Course: Patient's hospital course remained stable.  She was seen by cardiology after admission.  She underwent echocardiogram which showed ejection fraction  of 65 to 70%, no wall motion abnormality.  Her presyncopal episode might be associated  with vasovagal etiology.  Cardiology wants to follow her up as an outpatient for event monitoring.  She also had some chest pain last night but it has resolved now.  Troponins were normal.  EKG did not show any ischemic changes.  She is hemodynamically stable for discharge today.  Following problems were addressed during hospitalization:  Bradycardia:Presented with Symptomatic sinus bradycardia.  Associated with lightheadedness and dizziness.  Also noted to be hypotensive when EMS arrived.  EKG just showed sinus bradycardia but did not show any bundle branch blocks.  Echocardiogram as above. She is hemodynamically stable for discharge today.  She will follow-up with cardiology as an outpatient for event monitoring.  Lightheadedness/dizziness/near syncope: Most likely associated with bradycardia on presentation.  The symptoms are not present now. Negative orthostatic vitals.  Hypothyroidism: Continue Synthyroid.  TSH is normal.  History of thyrotoxicosis status post radiation therapy.  Hypertension: Currently blood pressure ok.   Patient was on olmesartan-amlodipine/hydrochlorothiazide combination.  Continue only amlodipine 5mg   at home.  Discharge Diagnoses:  Principal Problem:   Bradycardia Active Problems:   Hypothyroidism   Essential hypertension   Near syncope    Discharge Instructions  Discharge Instructions    Diet - low sodium heart healthy   Complete by:  As directed    Discharge instructions   Complete by:  As directed    1) Follow up with your PCP in a week. 2)Do a BMP test in a week during the follow up with your PCP.  3)Follow up with cardiology as an outpatient. 4)Take prescribed medications as instructed.   Increase activity slowly   Complete by:  As directed      Allergies as of 02/08/2018      Reactions   Codeine Hives, Nausea Only   Propofol    Burning all over feeling  and IV pain during colonoscopy sedation (before she went out)      Medication List    STOP taking these medications   Olmesartan-amLODIPine-HCTZ 40-10-25 MG Tabs     TAKE these medications   acetaminophen 500 MG tablet Commonly known as:  TYLENOL Take 1,000 mg by mouth as needed for mild pain or headache.   ALAWAY 0.025 % ophthalmic solution Generic drug:  ketotifen Place 1 drop into both eyes as needed (itchy eyes).   amLODipine 5 MG tablet Commonly known as:  NORVASC Take 1 tablet (5 mg total) by mouth daily.   naproxen 500 MG tablet Commonly known as:  NAPROSYN Take 1 tablet (500 mg total) by mouth 2 (two) times daily as needed (use pc).   potassium chloride SA 20 MEQ tablet Commonly known as:  K-DUR,KLOR-CON TAKE 1 TABLET BY MOUTH EVERY DAY   SYNTHROID 75 MCG tablet Generic drug:  levothyroxine TAKE 1 TABLET BY MOUTH DAILY BEFORE BREAKFAST. What changed:  See the new instructions.   Vitamin D3 1000 units Caps Take 1,000 Units by mouth daily.   vitamin E 400 UNIT capsule Take 400 Units by mouth daily.      Follow-up Information    Plotnikov, Georgina Quint, MD. Schedule an appointment as soon as possible for a visit in 1 week(s).   Specialty:  Internal Medicine Contact information: [address] [place] [address] [phone]        Lewayne Bunting, MD. Schedule an appointment as soon as possible for a visit in 2 week(s).   Specialty:  Cardiology Contact information: [address] [place] [address] [phone]          Allergies  Allergen Reactions  . Codeine Hives and Nausea Only  . Propofol     Burning all over feeling and IV pain during colonoscopy sedation (before she went out)    Consultations:  Cardiology   Procedures/Studies: Dg Chest 2 View  Result Date: 02/06/2018 CLINICAL DATA:  Syncopal episode today. EXAM: CHEST - 2 VIEW COMPARISON:  PA and lateral chest 07/20/2010 and 06/07/2008. FINDINGS: The lungs are  clear. Heart size is normal. No pneumothorax or pleural effusion. No acute or focal bony abnormality. IMPRESSION: Negative chest. Electronically Signed   By: Drusilla Kanner M.D.   On: 02/06/2018 13:53      Subjective: Patient seen and examined the bedside this morning.  He was comfortabe.  Chest pain has resolved.  Echocardiogram was normal.  Stable for discharge.  Discharge Exam: Vitals:   02/07/18 1907 02/08/18 0539  BP: 106/74 102/71  Pulse: 60 (!) 48  Resp: 18 16  Temp: 98.2 F (36.8 C) 97.7 F (36.5 C)  SpO2:  99%   Vitals:   02/07/18 1200 02/07/18 1215 02/07/18 1907 02/08/18 0539  BP: 119/76  106/74 102/71  Pulse:  (!) 54 60 (!) 48  Resp: (!) 21 17 18 16   Temp:   98.2 F (36.8 C) 97.7 F (36.5 C)  TempSrc:   Oral Oral  SpO2:  99%  99%  Weight:      Height:        General: Pt is alert, awake,  not in acute distress Cardiovascular: RRR, S1/S2 +, no rubs, no gallops Respiratory: CTA bilaterally, no wheezing, no rhonchi Abdominal: Soft, NT, ND, bowel sounds + Extremities: no edema, no cyanosis    The results of significant diagnostics from this hospitalization (including imaging, microbiology, ancillary and laboratory) are listed below for reference.     Microbiology: No results found for this or any previous visit (from the past 240 hour(s)).   Labs: BNP (last 3 results) No results for input(s): BNP in the last 8760 hours. Basic Metabolic Panel: Recent Labs  Lab 02/06/18 1221 02/06/18 1428 02/07/18 0124 02/08/18 0404  NA 139  --  144 141  K 3.5  --  3.3* 3.4*  CL 105  --  107 111  CO2 25  --  27 26  GLUCOSE 133*  --  101* 86  BUN 15  --  11 11  CREATININE 1.30*  --  1.29* 1.11*  CALCIUM 9.2  --  9.0 8.7*  MG  --  1.8  --   --    Liver Function Tests: Recent Labs  Lab 02/06/18 1221  AST 23  ALT 12  ALKPHOS 83  BILITOT 0.5  PROT 6.6  ALBUMIN 3.7   Recent Labs  Lab 02/06/18 1221  LIPASE 31   No results for input(s): AMMONIA in the  last 168 hours. CBC: Recent Labs  Lab 02/06/18 1221  WBC 6.5  NEUTROABS 4.2  HGB 11.0*  HCT 34.1*  MCV 82.0  PLT 231   Cardiac Enzymes: Recent Labs  Lab 02/07/18 2230 02/08/18 0404  TROPONINI <0.03 <0.03   BNP: Invalid input(s): POCBNP CBG: No results for input(s): GLUCAP in the last 168 hours. D-Dimer No results for input(s): DDIMER in the last 72 hours. Hgb A1c No results for input(s): HGBA1C in the last 72 hours. Lipid Profile No results for input(s): CHOL, HDL, LDLCALC, TRIG, CHOLHDL, LDLDIRECT in the last 72 hours. Thyroid function studies Recent Labs    02/06/18 1449  TSH 0.724   Anemia work up No results for input(s): VITAMINB12, FOLATE, FERRITIN, TIBC, IRON, RETICCTPCT in the last 72 hours. Urinalysis    Component Value Date/Time   COLORURINE STRAW (A) 02/06/2018 1602   APPEARANCEUR CLEAR 02/06/2018 1602   LABSPEC 1.006 02/06/2018 1602   PHURINE 5.0 02/06/2018 1602   GLUCOSEU NEGATIVE 02/06/2018 1602   GLUCOSEU NEGATIVE 03/21/2017 0901   HGBUR NEGATIVE 02/06/2018 1602   BILIRUBINUR NEGATIVE 02/06/2018 1602   KETONESUR NEGATIVE 02/06/2018 1602   PROTEINUR NEGATIVE 02/06/2018 1602   UROBILINOGEN 0.2 03/21/2017 0901   NITRITE NEGATIVE 02/06/2018 1602   LEUKOCYTESUR NEGATIVE 02/06/2018 1602   Sepsis Labs Invalid input(s): PROCALCITONIN,  WBC,  LACTICIDVEN Microbiology No results found for this or any previous visit (from the past 240 hour(s)).  Please note: You were cared for by a hospitalist during your hospital stay. Once you are discharged, your primary care physician will handle any further medical issues. Please note that NO REFILLS for any discharge medications will be authorized once you are discharged, as it is imperative that you return to your primary care physician (or establish a relationship with a primary care physician if you do not have one) for your post hospital discharge needs so that they can reassess your need for medications and  monitor your lab values.    Time coordinating discharge: 40 minutes  SIGNED:   Burnadette Pop, MD  [facility] 02/08/2018, 10:52 AM Pager [phone]  If 7PM-7AM, please contact night-coverage [email or web address] Password [email or web address]

## 2018-02-07 NOTE — ED Notes (Signed)
Report given to 6E RN 

## 2018-02-07 NOTE — Progress Notes (Signed)
Pt c/o chest pain. Pressure in mid chest.  Administered nitro SL x1.  Chest pain eased off, but some discomfort. EKG is done.  Pt is lying in bed with O2 4L via [place].  Hinton DyerYoko Kourtney Montesinos, RN

## 2018-02-07 NOTE — Telephone Encounter (Signed)
Daughter called [facility] stating that patient has been admitted to the hospital.  States patient was admitted for "significant decrease for HR and BP".  States patient is waiting for a cardio work up.  Daughter calling just to inform Dr. Posey ReaPlotnikov.

## 2018-02-07 NOTE — Progress Notes (Signed)
Progress Note  Patient Name: Tina Rivera Date of Encounter: 02/07/2018  Primary Cardiologist: Olga MillersBrian Crenshaw, MD   Subjective   No chest pain, no dyspnea  Inpatient Medications    Scheduled Meds: . levothyroxine  75 mcg Oral QAC breakfast   Continuous Infusions: . sodium chloride 75 mL/hr at 02/06/18 2245   PRN Meds:    Vital Signs    Vitals:   02/07/18 0600 02/07/18 0700 02/07/18 0800 02/07/18 0900  BP: 109/76 105/72 104/70 99/70  Pulse: (!) 44 (!) 50 (!) 50 (!) 50  Resp: 11 17 14 13   Temp:      TempSrc:      SpO2: 98% 98% 98% 99%  Weight:      Height:        Intake/Output Summary (Last 24 hours) at 02/07/2018 0958 Last data filed at 02/06/2018 1506 Gross per 24 hour  Intake 1000 ml  Output -  Net 1000 ml   Filed Weights   02/06/18 1221  Weight: 79.4 kg    Telemetry    Sinus bradycardia with PACs- Personally Reviewed   Physical Exam   GEN: No acute distress.   Neck: No JVD Cardiac: Regular and bradycardic Respiratory: Clear to auscultation bilaterally. GI: Soft, nontender, non-distended  MS: No edema Neuro:  Nonfocal  Psych: Normal affect   Labs    Chemistry Recent Labs  Lab 02/06/18 1221 02/07/18 0124  NA 139 144  K 3.5 3.3*  CL 105 107  CO2 25 27  GLUCOSE 133* 101*  BUN 15 11  CREATININE 1.30* 1.29*  CALCIUM 9.2 9.0  PROT 6.6  --   ALBUMIN 3.7  --   AST 23  --   ALT 12  --   ALKPHOS 83  --   BILITOT 0.5  --   GFRNONAA 45* 45*  GFRAA 52* 53*  ANIONGAP 9 10     Hematology Recent Labs  Lab 02/06/18 1221  WBC 6.5  RBC 4.16  HGB 11.0*  HCT 34.1*  MCV 82.0  MCH 26.4  MCHC 32.3  RDW 14.9  PLT 231    Recent Labs  Lab 02/06/18 1255  TROPIPOC 0.00     Radiology    Dg Chest 2 View  Result Date: 02/06/2018 CLINICAL DATA:  Syncopal episode today. EXAM: CHEST - 2 VIEW COMPARISON:  PA and lateral chest 07/20/2010 and 06/07/2008. FINDINGS: The lungs are clear. Heart size is normal. No pneumothorax or pleural  effusion. No acute or focal bony abnormality. IMPRESSION: Negative chest. Electronically Signed   By: Drusilla Kannerhomas  Dalessio M.D.   On: 02/06/2018 13:53    Patient Profile     57 y.o. female with presyncope  Assessment & Plan    1 dizziness/near syncope-symptoms resolved; h/o what sounds to be vagal syncope. Await echo. If LV function normal, DC today with outpt event monitor. Note she was mildly bradycardic but unclear if this caused presenting symptoms.  Note given probable history of vasovagal syncope we discussed the importance of maintaining hydration and sodium intake.  2 hypertension-BP controlled this AM. Would continue ARB and amlodipine (preadmission doses) as needed but DC HCTZ  3 hypothyroid-management per primary care. [facility] will sign off.   Medication Recommendations:  BP meds as outlined above Other recommendations (labs, testing, etc):  Will arrange event monitor at DC Follow up as an outpatient:  APP appt 2-4 weeks  For questions or updates, please contact [facility] Please consult [email or web address] for contact info under Cardiology/STEMI.  Signed, Olga Millers, MD  02/07/2018, 9:58 AM

## 2018-02-07 NOTE — ED Notes (Signed)
Pt ambulated to the bathroom with steady gait.

## 2018-02-07 NOTE — Progress Notes (Signed)
PROGRESS NOTE    Tina Rivera  VHQ:[id number] DOB: 05-07-1961 DOA: 02/06/2018 PCP: Tresa Garter, MD   Brief Narrative:  Came with presyncopal episode.Waiting for echo. Her presyncopal episode might be treated with vasovagal etiology.  Cardiology wants to follow her up as an outpatient for event monitoring.   Assessment & Plan:   Principal Problem:   Bradycardia Active Problems:   Hypothyroidism   Essential hypertension   Near syncope  Bradycardia:Presented with Symptomatic sinus bradycardia. Associated with lightheadedness and dizziness. Also noted to be hypotensive when EMS arrived. EKG just showed sinus bradycardia butdid not show any bundle branch blocks.Echocardiogram is pending.   She will follow-up with cardiology as an outpatient for event monitoring.  Lightheadedness/dizziness/near syncope: Most likelyassociatedwith bradycardia on presentation.  The symptoms are not present now.Negative orthostatic vitals.  Hypothyroidism:Continue Synthyroid. TSHis normal.History of thyrotoxicosis status post radiation therapy.  Hypertension: Currently blood pressure ok.  Patient was on olmesartan-amlodipine/hydrochlorothiazide combination.  Continue olmesartan and  amlodipine but discontinue hydrochlorothiazide on discharge.    DVT prophylaxis:None Code Status: Full Family Communication: Daughter Disposition Plan: Home tomorrow   Consultants: Cardiology  Procedures:None  Antimicrobials:None  Subjective: Patient seen and examined at bed side.No active issues.waiting for echo.  Objective: Vitals:   02/07/18 1030 02/07/18 1100 02/07/18 1200 02/07/18 1215  BP:  114/75 119/76   Pulse: (!) 58 (!) 50  (!) 54  Resp: (!) 21 15 (!) 21 17  Temp:      TempSrc:      SpO2: 100% 99%  99%  Weight:      Height:        Intake/Output Summary (Last 24 hours) at 02/07/2018 1756 Last data filed at 02/07/2018 1500 Gross per 24 hour  Intake 450 ml  Output -    Net 450 ml   Filed Weights   02/06/18 1221  Weight: 79.4 kg    Examination:  General exam: Appears calm and comfortable ,Not in distress,average built HEENT:PERRL,Oral mucosa moist, Ear/Nose normal on gross exam Respiratory system: Bilateral equal air entry, normal vesicular breath sounds, no wheezes or crackles  Cardiovascular system: S1 & S2 heard, RRR. No JVD, murmurs, rubs, gallops or clicks. No pedal edema. Gastrointestinal system: Abdomen is nondistended, soft and nontender. No organomegaly or masses felt. Normal bowel sounds heard. Central nervous system: Alert and oriented. No focal neurological deficits. Extremities: No edema, no clubbing ,no cyanosis, distal peripheral pulses palpable. Skin: No rashes, lesions or ulcers,no icterus ,no pallor MSK: Normal muscle bulk,tone ,power Psychiatry: Judgement and insight appear normal. Mood & affect appropriate.     Data Reviewed: I have personally reviewed following labs and imaging studies  CBC: Recent Labs  Lab 02/06/18 1221  WBC 6.5  NEUTROABS 4.2  HGB 11.0*  HCT 34.1*  MCV 82.0  PLT 231   Basic Metabolic Panel: Recent Labs  Lab 02/06/18 1221 02/06/18 1428 02/07/18 0124  NA 139  --  144  K 3.5  --  3.3*  CL 105  --  107  CO2 25  --  27  GLUCOSE 133*  --  101*  BUN 15  --  11  CREATININE 1.30*  --  1.29*  CALCIUM 9.2  --  9.0  MG  --  1.8  --    GFR: Estimated Creatinine Clearance: 49.1 mL/min (A) (by C-G formula based on SCr of 1.29 mg/dL (H)). Liver Function Tests: Recent Labs  Lab 02/06/18 1221  AST 23  ALT 12  ALKPHOS 83  BILITOT 0.5  PROT 6.6  ALBUMIN 3.7   Recent Labs  Lab 02/06/18 1221  LIPASE 31   No results for input(s): AMMONIA in the last 168 hours. Coagulation Profile: No results for input(s): INR, PROTIME in the last 168 hours. Cardiac Enzymes: No results for input(s): CKTOTAL, CKMB, CKMBINDEX, TROPONINI in the last 168 hours. BNP (last 3 results) No results for input(s):  PROBNP in the last 8760 hours. HbA1C: No results for input(s): HGBA1C in the last 72 hours. CBG: No results for input(s): GLUCAP in the last 168 hours. Lipid Profile: No results for input(s): CHOL, HDL, LDLCALC, TRIG, CHOLHDL, LDLDIRECT in the last 72 hours. Thyroid Function Tests: Recent Labs    02/06/18 1449  TSH 0.724   Anemia Panel: No results for input(s): VITAMINB12, FOLATE, FERRITIN, TIBC, IRON, RETICCTPCT in the last 72 hours. Sepsis Labs: No results for input(s): PROCALCITON, LATICACIDVEN in the last 168 hours.  No results found for this or any previous visit (from the past 240 hour(s)).       Radiology Studies: Dg Chest 2 View  Result Date: 02/06/2018 CLINICAL DATA:  Syncopal episode today. EXAM: CHEST - 2 VIEW COMPARISON:  PA and lateral chest 07/20/2010 and 06/07/2008. FINDINGS: The lungs are clear. Heart size is normal. No pneumothorax or pleural effusion. No acute or focal bony abnormality. IMPRESSION: Negative chest. Electronically Signed   By: Drusilla Kanner M.D.   On: 02/06/2018 13:53        Scheduled Meds: . levothyroxine  75 mcg Oral QAC breakfast   Continuous Infusions: . sodium chloride 75 mL/hr at 02/06/18 2245     LOS: 0 days    Time spent: 25 mins.More than 50% of that time was spent in counseling and/or coordination of care.      Burnadette Pop, MD [facility] Pager [phone]  If 7PM-7AM, please contact night-coverage [email or web address] Password [facility] 02/07/2018, 5:56 PM

## 2018-02-07 NOTE — Telephone Encounter (Signed)
FYI

## 2018-02-07 NOTE — ED Notes (Signed)
Gave pt graham crackers, per Jesse SansJanee' - RN.

## 2018-02-07 NOTE — ED Notes (Signed)
Pt's breakfast tray arrived 

## 2018-02-07 NOTE — ED Notes (Signed)
Pt ambulatory to restroom, steady gait.

## 2018-02-08 ENCOUNTER — Observation Stay (HOSPITAL_BASED_OUTPATIENT_CLINIC_OR_DEPARTMENT_OTHER): Payer: BC Managed Care – PPO

## 2018-02-08 ENCOUNTER — Ambulatory Visit (INDEPENDENT_AMBULATORY_CARE_PROVIDER_SITE_OTHER): Payer: BC Managed Care – PPO

## 2018-02-08 ENCOUNTER — Other Ambulatory Visit: Payer: Self-pay

## 2018-02-08 DIAGNOSIS — I361 Nonrheumatic tricuspid (valve) insufficiency: Secondary | ICD-10-CM | POA: Diagnosis not present

## 2018-02-08 DIAGNOSIS — R072 Precordial pain: Secondary | ICD-10-CM

## 2018-02-08 DIAGNOSIS — I340 Nonrheumatic mitral (valve) insufficiency: Secondary | ICD-10-CM

## 2018-02-08 DIAGNOSIS — R55 Syncope and collapse: Secondary | ICD-10-CM

## 2018-02-08 DIAGNOSIS — R001 Bradycardia, unspecified: Secondary | ICD-10-CM

## 2018-02-08 LAB — BASIC METABOLIC PANEL
Anion gap: 4 — ABNORMAL LOW (ref 5–15)
BUN: 11 mg/dL (ref 6–20)
CALCIUM: 8.7 mg/dL — AB (ref 8.9–10.3)
CO2: 26 mmol/L (ref 22–32)
Chloride: 111 mmol/L (ref 98–111)
Creatinine, Ser: 1.11 mg/dL — ABNORMAL HIGH (ref 0.44–1.00)
GFR calc Af Amer: >60 mL/min (ref >60)
GFR, EST NON AFRICAN AMERICAN: 54 mL/min — AB (ref >60)
GLUCOSE: 86 mg/dL (ref 70–99)
Potassium: 3.4 mmol/L — ABNORMAL LOW (ref 3.5–5.1)
Sodium: 141 mmol/L (ref 135–145)

## 2018-02-08 LAB — ECHOCARDIOGRAM COMPLETE
HEIGHTINCHES: 63.5 in
Weight: 2800 oz

## 2018-02-08 LAB — TROPONIN I
Troponin I: <0.03 ng/mL (ref <0.03)
Troponin I: <0.03 ng/mL (ref <0.03)
Troponin I: <0.03 ng/mL (ref <0.03)

## 2018-02-08 MED ORDER — AMLODIPINE BESYLATE 5 MG PO TABS: 5.0000 mg | ORAL_TABLET | Freq: Every day | ORAL | 0 refills | Status: DC

## 2018-02-08 MED ORDER — POTASSIUM CHLORIDE CRYS ER 20 MEQ PO TBCR
40.0000 meq | EXTENDED_RELEASE_TABLET | Freq: Once | ORAL | Status: AC
  Administered 2018-02-08: 40 meq via ORAL
  Filled 2018-02-08: qty 2

## 2018-02-08 NOTE — Progress Notes (Addendum)
Progress Note  Patient Name: Tina Rivera Date of Encounter: 02/08/2018  Primary Cardiologist: Olga Millers, MD   Subjective   Pt denies CP or dyspnea; brief (5 minutes) chest pain last evening that resolved spontaneously.  No radiation or associated symptoms.  Inpatient Medications    Scheduled Meds: . levothyroxine  75 mcg Oral QAC breakfast   Continuous Infusions:  PRN Meds:    Vital Signs    Vitals:   02/07/18 1200 02/07/18 1215 02/07/18 1907 02/08/18 0539  BP: 119/76  106/74 102/71  Pulse:  (!) 54 60 (!) 48  Resp: (!) 21 17 18 16   Temp:   98.2 F (36.8 C) 97.7 F (36.5 C)  TempSrc:   Oral Oral  SpO2:  99%  99%  Weight:      Height:        Intake/Output Summary (Last 24 hours) at 02/08/2018 0658 Last data filed at 02/07/2018 1900 Gross per 24 hour  Intake 990 ml  Output -  Net 990 ml   Filed Weights   02/06/18 1221  Weight: 79.4 kg    Telemetry    Sinus bradycardia- Personally Reviewed   Physical Exam   GEN: WD WN No acute distress.   Neck: No JVD, supple Cardiac: Bradycardic Respiratory: Clear to auscultation bilaterally; no wheeze. GI: Soft, NT/ND MS: No edema Neuro:  Grossly intact Symptoms  Labs    Chemistry Recent Labs  Lab 02/06/18 1221 02/07/18 0124 02/08/18 0404  NA 139 144 141  K 3.5 3.3* 3.4*  CL 105 107 111  CO2 25 27 26   GLUCOSE 133* 101* 86  BUN 15 11 11   CREATININE 1.30* 1.29* 1.11*  CALCIUM 9.2 9.0 8.7*  PROT 6.6  --   --   ALBUMIN 3.7  --   --   AST 23  --   --   ALT 12  --   --   ALKPHOS 83  --   --   BILITOT 0.5  --   --   GFRNONAA 45* 45* 54*  GFRAA 52* 53* >60  ANIONGAP 9 10 4*     Hematology Recent Labs  Lab 02/06/18 1221  WBC 6.5  RBC 4.16  HGB 11.0*  HCT 34.1*  MCV 82.0  MCH 26.4  MCHC 32.3  RDW 14.9  PLT 231    Recent Labs  Lab 02/06/18 1255  TROPIPOC 0.00     Radiology    Dg Chest 2 View  Result Date: 02/06/2018 CLINICAL DATA:  Syncopal episode today. EXAM: CHEST -  2 VIEW COMPARISON:  PA and lateral chest 07/20/2010 and 06/07/2008. FINDINGS: The lungs are clear. Heart size is normal. No pneumothorax or pleural effusion. No acute or focal bony abnormality. IMPRESSION: Negative chest. Electronically Signed   By: Drusilla Kanner M.D.   On: 02/06/2018 13:53    Patient Profile     57 y.o. female with presyncope  Assessment & Plan    1 dizziness/near syncope-no further symptoms.  We are awaiting echocardiogram to assess LV function.  We are awaiting results of echocardiogram.  If LV function normal plan discharge with outpatient event monitor.  If echocardiogram cannot be performed this morning she can be discharged and this be arranged as an outpatient.  Note given probable history of vasovagal syncope we discussed the importance of maintaining hydration and sodium intake.  2 hypertension-blood pressure remains controlled this morning and she is not receiving blood pressure medications.  I would like for her to remain  off of hydrochlorothiazide given history of vagal syncope.  Can resume low-dose amlodipine (5 mg) at discharge.  Follow blood pressure as an outpatient and advance medications as needed.  3 chest pain-brief chest pain yesterday that is atypical.  Enzymes negative.  Electrocardiogram with no diagnostic ST changes.  No plans for further ischemia evaluation.  4  hypothyroid-management per primary care.  5 hypokalemia-supplement  [facility] will sign off.   Medication Recommendations:  BP meds as outlined above Other recommendations (labs, testing, etc):  Will arrange event monitor at DC Follow up as an outpatient:  APP appt 2-4 weeks  For questions or updates, please contact [facility] Please consult [email or web address] for contact info under Cardiology/STEMI.      Signed, Olga MillersBrian Shalene Gallen, MD  02/08/2018, 6:58 AM

## 2018-02-08 NOTE — Progress Notes (Signed)
  Echocardiogram 2D Echocardiogram has been performed.  Roosvelt MaserLane, Syerra Abdelrahman F 02/08/2018, 9:21 AM

## 2018-02-19 ENCOUNTER — Ambulatory Visit: Payer: BC Managed Care – PPO | Admitting: Cardiology

## 2018-02-19 ENCOUNTER — Encounter: Payer: Self-pay | Admitting: Cardiology

## 2018-02-19 DIAGNOSIS — I10 Essential (primary) hypertension: Secondary | ICD-10-CM

## 2018-02-19 DIAGNOSIS — R55 Syncope and collapse: Secondary | ICD-10-CM | POA: Diagnosis not present

## 2018-02-19 MED ORDER — AMLODIPINE BESYLATE 10 MG PO TABS: 10.0000 mg | ORAL_TABLET | Freq: Every day | ORAL | 2 refills | Status: DC

## 2018-02-19 NOTE — Patient Instructions (Addendum)
Medication Instructions:  Increase Amlodipine to 10 mg daily.  If you need a refill on your cardiac medications before your next appointment, please call your pharmacy.  Labwork: None Ordered.  Testing/Procedures: None Ordered.  Follow-Up: Your physician wants you to follow-up in: 6 weeks with Dr.Crenshaw.    Thank you for choosing [facility] at [facility]!!

## 2018-02-19 NOTE — Assessment & Plan Note (Signed)
Felt to be vaso vagal- diuretic stopped. Event monitor pending

## 2018-02-19 NOTE — Progress Notes (Signed)
02/19/2018 Tina Rivera   03-15-1961  [id number]  Primary Physician Plotnikov, Georgina Quint, MD Primary Cardiologist: Dr Jens Som  HPI:  57 y/o female with a history of near syncope, felt to be vaso vagal, admitted 8/21-8/23/19 after a near syncopal spell. Echo was normal. Her diuretic was stopped. She is in the office for follow up. She is wearing a monitor but results are not in yet. She has had no further near syncope.    Current Outpatient Medications  Medication Sig Dispense Refill  . acetaminophen (TYLENOL) 500 MG tablet Take 1,000 mg by mouth as needed for mild pain or headache.    Marland Kitchen amLODipine (NORVASC) 10 MG tablet Take 1 tablet (10 mg total) by mouth daily. 30 tablet 2  . Cholecalciferol (VITAMIN D3) 1000 UNITS CAPS Take 1,000 Units by mouth daily.     Marland Kitchen ketotifen (ALAWAY) 0.025 % ophthalmic solution Place 1 drop into both eyes as needed (itchy eyes).    . naproxen (NAPROSYN) 500 MG tablet Take 1 tablet (500 mg total) by mouth 2 (two) times daily as needed (use pc). 60 tablet 2  . potassium chloride SA (KLOR-CON M20) 20 MEQ tablet TAKE 1 TABLET BY MOUTH EVERY DAY 30 tablet 11  . SYNTHROID 75 MCG tablet TAKE 1 TABLET BY MOUTH DAILY BEFORE BREAKFAST. (Patient taking differently: Take 75 mcg by mouth daily before breakfast. ) 90 tablet 2  . vitamin E 400 UNIT capsule Take 400 Units by mouth daily.       No current facility-administered medications for this visit.     Allergies  Allergen Reactions  . Codeine Hives and Nausea Only  . Propofol     Burning all over feeling and IV pain during colonoscopy sedation (before she went out)    Past Medical History:  Diagnosis Date  . Acute upper respiratory infections of unspecified site   . Allergic rhinitis, cause unspecified   . Asymptomatic postmenopausal status (age-related) (natural)   . Cough   . Family history of asthma   . Migraine without aura, without mention of intractable migraine without mention of status  migrainosus   . Pain in joint, pelvic region and thigh   . Thyrotoxicosis without mention of goiter or other cause, without mention of thyrotoxic crisis or storm   . Unspecified essential hypertension   . Warts     Social History   Socioeconomic History  . Marital status: Married    Spouse name: Not on file  . Number of children: Not on file  . Years of education: Not on file  . Highest education level: Not on file  Occupational History  . Occupation: Dentist  Social Needs  . Financial resource strain: Not on file  . Food insecurity:    Worry: Not on file    Inability: Not on file  . Transportation needs:    Medical: Not on file    Non-medical: Not on file  Tobacco Use  . Smoking status: Never Smoker  . Smokeless tobacco: Never Used  Substance and Sexual Activity  . Alcohol use: No  . Drug use: No  . Sexual activity: Yes  Lifestyle  . Physical activity:    Days per week: Not on file    Minutes per session: Not on file  . Stress: Not on file  Relationships  . Social connections:    Talks on phone: Not on file    Gets together: Not on file    Attends religious service: Not  on file    Active member of club or organization: Not on file    Attends meetings of clubs or organizations: Not on file    Relationship status: Not on file  . Intimate partner violence:    Fear of current or ex partner: Not on file    Emotionally abused: Not on file    Physically abused: Not on file    Forced sexual activity: Not on file  Other Topics Concern  . Not on file  Social History Narrative   Does drink fair amount of caffeine     Family History  Problem Relation Age of Onset  . Asthma Mother   . Asthma Sister   . Hypertension Other   . Colon cancer Neg Hx   . Diabetes Neg Hx   . Rectal cancer Neg Hx   . Stomach cancer Neg Hx      Review of Systems: General: negative for chills, fever, night sweats or weight changes.  Cardiovascular: negative for chest pain, dyspnea  on exertion, edema, orthopnea, palpitations, paroxysmal nocturnal dyspnea or shortness of breath Dermatological: negative for rash Respiratory: negative for cough or wheezing Urologic: negative for hematuria Abdominal: negative for nausea, vomiting, diarrhea, bright red blood per rectum, melena, or hematemesis Neurologic: negative for visual changes, syncope, or dizziness All other systems reviewed and are otherwise negative except as noted above.    Blood pressure 135/89, pulse 74, height 5' 3.5" (1.613 m), weight 183 lb 6.4 oz (83.2 kg), last menstrual period 02/05/2008.  General appearance: alert, cooperative and no distress Neck: no JVD Lungs: clear to auscultation bilaterally Heart: regular rate and rhythm Extremities: no edema Skin: Skin color, texture, turgor normal. No rashes or lesions Neurologic: Grossly normal    ASSESSMENT AND PLAN:   Near syncope Felt to be vaso vagal- diuretic stopped. Event monitor pending  Essential hypertension Her B/P has been running higher off diuretic. Increase Norvasc to 10 mg.   PLAN  Increase Norvasc to 10 mg, f/u 6 weeks.  Corine Shelter PA-C 02/19/2018 9:04 AM

## 2018-02-19 NOTE — Assessment & Plan Note (Signed)
Her B/P has been running higher off diuretic. Increase Norvasc to 10 mg.

## 2018-03-01 ENCOUNTER — Encounter: Payer: Self-pay | Admitting: Internal Medicine

## 2018-03-01 ENCOUNTER — Ambulatory Visit: Payer: BC Managed Care – PPO | Admitting: Internal Medicine

## 2018-03-01 VITALS — BP 134/88 | HR 68 | Temp 98.1°F | Ht 63.5 in | Wt 181.0 lb

## 2018-03-01 DIAGNOSIS — R232 Flushing: Secondary | ICD-10-CM

## 2018-03-01 DIAGNOSIS — R7989 Other specified abnormal findings of blood chemistry: Secondary | ICD-10-CM

## 2018-03-01 DIAGNOSIS — Z0000 Encounter for general adult medical examination without abnormal findings: Secondary | ICD-10-CM | POA: Diagnosis not present

## 2018-03-01 DIAGNOSIS — R55 Syncope and collapse: Secondary | ICD-10-CM | POA: Diagnosis not present

## 2018-03-01 DIAGNOSIS — D649 Anemia, unspecified: Secondary | ICD-10-CM

## 2018-03-01 DIAGNOSIS — G4700 Insomnia, unspecified: Secondary | ICD-10-CM

## 2018-03-01 DIAGNOSIS — R001 Bradycardia, unspecified: Secondary | ICD-10-CM

## 2018-03-01 DIAGNOSIS — IMO0002 Reserved for concepts with insufficient information to code with codable children: Secondary | ICD-10-CM | POA: Insufficient documentation

## 2018-03-01 DIAGNOSIS — I10 Essential (primary) hypertension: Secondary | ICD-10-CM

## 2018-03-01 NOTE — Assessment & Plan Note (Signed)
BMET [place]

## 2018-03-01 NOTE — Assessment & Plan Note (Signed)
Norvasc 10mg  

## 2018-03-01 NOTE — Assessment & Plan Note (Signed)
?  bradycardia related On a monitor now

## 2018-03-01 NOTE — Assessment & Plan Note (Signed)
Menopausal now

## 2018-03-01 NOTE — Assessment & Plan Note (Signed)
Heart monitor

## 2018-03-01 NOTE — Progress Notes (Signed)
Subjective:  Patient ID: Tina Rivera, female    DOB: 1960/11/28  Age: 57 y.o. MRN: [id number]  CC: No chief complaint on file.   HPI Tina Rivera presents for post-hosp f/u for bradycardia (d/c 8/23) - on a heart monitor now. Her creat ws up alittle, slight anemia f/u  Outpatient Medications Prior to Visit  Medication Sig Dispense Refill  . acetaminophen (TYLENOL) 500 MG tablet Take 1,000 mg by mouth as needed for mild pain or headache.    Marland Kitchen. amLODipine (NORVASC) 10 MG tablet Take 1 tablet (10 mg total) by mouth daily. 30 tablet 2  . Cholecalciferol (VITAMIN D3) 1000 UNITS CAPS Take 1,000 Units by mouth daily.     Marland Kitchen. ketotifen (ALAWAY) 0.025 % ophthalmic solution Place 1 drop into both eyes as needed (itchy eyes).    . naproxen (NAPROSYN) 500 MG tablet Take 1 tablet (500 mg total) by mouth 2 (two) times daily as needed (use pc). 60 tablet 2  . potassium chloride SA (KLOR-CON M20) 20 MEQ tablet TAKE 1 TABLET BY MOUTH EVERY DAY 30 tablet 11  . SYNTHROID 75 MCG tablet TAKE 1 TABLET BY MOUTH DAILY BEFORE BREAKFAST. (Patient taking differently: Take 75 mcg by mouth daily before breakfast. ) 90 tablet 2  . vitamin E 400 UNIT capsule Take 400 Units by mouth daily.       No facility-administered medications prior to visit.     ROS: Review of Systems  Constitutional: Negative for activity change, appetite change, chills, fatigue and unexpected weight change.  HENT: Negative for congestion, mouth sores and sinus pressure.   Eyes: Negative for visual disturbance.  Respiratory: Negative for cough and chest tightness.   Gastrointestinal: Negative for abdominal pain and nausea.  Genitourinary: Negative for difficulty urinating, frequency and vaginal pain.  Musculoskeletal: Negative for back pain and gait problem.  Skin: Negative for pallor and rash.  Neurological: Negative for dizziness, tremors, weakness, numbness and headaches.  Psychiatric/Behavioral: Negative for confusion, sleep  disturbance and suicidal ideas.    Objective:  BP 134/88 (BP Location: Left Arm, Patient Position: Sitting, Cuff Size: Large)   Pulse 68   Temp 98.1 F (36.7 C) (Oral)   Ht 5' 3.5" (1.613 m)   Wt 181 lb (82.1 kg)   LMP 02/05/2008   SpO2 96%   BMI 31.56 kg/m   BP Readings from Last 3 Encounters:  03/01/18 134/88  02/19/18 135/89  02/08/18 102/71    Wt Readings from Last 3 Encounters:  03/01/18 181 lb (82.1 kg)  02/19/18 183 lb 6.4 oz (83.2 kg)  02/06/18 175 lb (79.4 kg)    Physical Exam  Constitutional: She appears well-developed. No distress.  HENT:  Head: Normocephalic.  Right Ear: External ear normal.  Left Ear: External ear normal.  Nose: Nose normal.  Mouth/Throat: Oropharynx is clear and moist.  Eyes: Pupils are equal, round, and reactive to light. Conjunctivae are normal. Right eye exhibits no discharge. Left eye exhibits no discharge.  Neck: Normal range of motion. Neck supple. No JVD present. No tracheal deviation present. No thyromegaly present.  Cardiovascular: Normal rate, regular rhythm and normal heart sounds.  Pulmonary/Chest: No stridor. No respiratory distress. She has no wheezes.  Abdominal: Soft. Bowel sounds are normal. She exhibits no distension and no mass. There is no tenderness. There is no rebound and no guarding.  Musculoskeletal: She exhibits no edema or tenderness.  Lymphadenopathy:    She has no cervical adenopathy.  Neurological: She displays normal reflexes. No  cranial nerve deficit. She exhibits normal muscle tone. Coordination normal.  Skin: No rash noted. No erythema.  Psychiatric: She has a normal mood and affect. Her behavior is normal. Judgment and thought content normal.    Lab Results  Component Value Date   WBC 6.5 02/06/2018   HGB 11.0 (L) 02/06/2018   HCT 34.1 (L) 02/06/2018   PLT 231 02/06/2018   GLUCOSE 86 02/08/2018   CHOL 128 03/21/2017   TRIG 53.0 03/21/2017   HDL 66.70 03/21/2017   LDLCALC 51 03/21/2017   ALT 12  02/06/2018   AST 23 02/06/2018   NA 141 02/08/2018   K 3.4 (L) 02/08/2018   CL 111 02/08/2018   CREATININE 1.11 (H) 02/08/2018   BUN 11 02/08/2018   CO2 26 02/08/2018   TSH 0.724 02/06/2018    Dg Chest 2 View  Result Date: 02/06/2018 CLINICAL DATA:  Syncopal episode today. EXAM: CHEST - 2 VIEW COMPARISON:  PA and lateral chest 07/20/2010 and 06/07/2008. FINDINGS: The lungs are clear. Heart size is normal. No pneumothorax or pleural effusion. No acute or focal bony abnormality. IMPRESSION: Negative chest. Electronically Signed   By: Drusilla Kanner M.D.   On: 02/06/2018 13:53    Assessment & Plan:   There are no diagnoses linked to this encounter.   No orders of the defined types were placed in this encounter.    Follow-up: No follow-ups on file.  Sonda Primes, MD

## 2018-03-01 NOTE — Assessment & Plan Note (Signed)
mild

## 2018-03-01 NOTE — Assessment & Plan Note (Signed)
Better  

## 2018-03-19 ENCOUNTER — Telehealth: Payer: Self-pay | Admitting: Internal Medicine

## 2018-03-19 NOTE — Telephone Encounter (Signed)
Copied from CRM [id number]. Topic: General - Other >> Mar 19, 2018  9:13 AM Tamela Oddi wrote: Reason for CRM: Ferdinand Lango with [facility] called to get clarification on an order for an ultrasound.  Please call at [phone]

## 2018-03-19 NOTE — Telephone Encounter (Signed)
Order changed.

## 2018-03-19 NOTE — Addendum Note (Signed)
Addended by: Scarlett Presto on: 03/19/2018 01:16 PM   Modules accepted: Orders

## 2018-03-20 ENCOUNTER — Ambulatory Visit: Payer: BC Managed Care – PPO | Admitting: Cardiology

## 2018-03-20 ENCOUNTER — Telehealth: Payer: Self-pay

## 2018-03-20 NOTE — Telephone Encounter (Signed)
Spoke with pt and made her aware of her cardiac monitor results. Notes recorded by Beatriz Stallion., PA-C on 03/19/2018 at 12:55 PM EDT Please notify the patient that her heart monitor did not show any evidence of abnormal heart rhythms. Thank you  Pt was seen on 9/3 by Sherre Poot and was suppose to f/u with Dr.Crenshaw in 6 weeks. Offered to schedule the about with Dr.Crenshaw, pt sts that she will callback to schedule

## 2018-03-25 ENCOUNTER — Ambulatory Visit (INDEPENDENT_AMBULATORY_CARE_PROVIDER_SITE_OTHER): Payer: BC Managed Care – PPO | Admitting: Internal Medicine

## 2018-03-25 ENCOUNTER — Other Ambulatory Visit (INDEPENDENT_AMBULATORY_CARE_PROVIDER_SITE_OTHER): Payer: BC Managed Care – PPO

## 2018-03-25 ENCOUNTER — Encounter: Payer: Self-pay | Admitting: Internal Medicine

## 2018-03-25 VITALS — BP 138/86 | HR 69 | Temp 98.0°F | Ht 63.5 in | Wt 178.0 lb

## 2018-03-25 DIAGNOSIS — I10 Essential (primary) hypertension: Secondary | ICD-10-CM | POA: Diagnosis not present

## 2018-03-25 DIAGNOSIS — R7989 Other specified abnormal findings of blood chemistry: Secondary | ICD-10-CM | POA: Diagnosis not present

## 2018-03-25 DIAGNOSIS — D649 Anemia, unspecified: Secondary | ICD-10-CM

## 2018-03-25 DIAGNOSIS — Z0000 Encounter for general adult medical examination without abnormal findings: Secondary | ICD-10-CM | POA: Diagnosis not present

## 2018-03-25 DIAGNOSIS — Z23 Encounter for immunization: Secondary | ICD-10-CM | POA: Diagnosis not present

## 2018-03-25 LAB — HEPATIC FUNCTION PANEL
ALK PHOS: 101 U/L (ref 39–117)
ALT: 12 U/L (ref 0–35)
AST: 18 U/L (ref 0–37)
Albumin: 4.3 g/dL (ref 3.5–5.2)
BILIRUBIN DIRECT: 0.1 mg/dL (ref 0.0–0.3)
BILIRUBIN TOTAL: 0.4 mg/dL (ref 0.2–1.2)
TOTAL PROTEIN: 7.7 g/dL (ref 6.0–8.3)

## 2018-03-25 LAB — CBC WITH DIFFERENTIAL/PLATELET
Basophils Absolute: 0 10*3/uL (ref 0.0–0.1)
Basophils Relative: 0.6 % (ref 0.0–3.0)
EOS PCT: 1.7 % (ref 0.0–5.0)
Eosinophils Absolute: 0.1 10*3/uL (ref 0.0–0.7)
HEMATOCRIT: 35.4 % — AB (ref 36.0–46.0)
HEMOGLOBIN: 11.9 g/dL — AB (ref 12.0–15.0)
LYMPHS PCT: 44.1 % (ref 12.0–46.0)
Lymphs Abs: 2.5 10*3/uL (ref 0.7–4.0)
MCHC: 33.7 g/dL (ref 30.0–36.0)
MCV: 79.4 fl (ref 78.0–100.0)
MONO ABS: 0.5 10*3/uL (ref 0.1–1.0)
Monocytes Relative: 8.5 % (ref 3.0–12.0)
Neutro Abs: 2.5 10*3/uL (ref 1.4–7.7)
Neutrophils Relative %: 45.1 % (ref 43.0–77.0)
Platelets: 284 10*3/uL (ref 150.0–400.0)
RBC: 4.46 Mil/uL (ref 3.87–5.11)
RDW: 15.6 % — ABNORMAL HIGH (ref 11.5–15.5)
WBC: 5.6 10*3/uL (ref 4.0–10.5)

## 2018-03-25 LAB — BASIC METABOLIC PANEL
BUN: 15 mg/dL (ref 6–23)
CO2: 25 mEq/L (ref 19–32)
CREATININE: 1.08 mg/dL (ref 0.40–1.20)
Calcium: 9.8 mg/dL (ref 8.4–10.5)
Chloride: 105 mEq/L (ref 96–112)
GFR: 67.42 mL/min (ref >60.00)
GLUCOSE: 91 mg/dL (ref 70–99)
POTASSIUM: 3.8 meq/L (ref 3.5–5.1)
Sodium: 139 mEq/L (ref 135–145)

## 2018-03-25 LAB — LIPID PANEL
CHOLESTEROL: 132 mg/dL (ref 0–200)
HDL: 61.3 mg/dL (ref >39.00)
LDL Cholesterol: 57 mg/dL (ref 0–99)
NONHDL: 71.08
Total CHOL/HDL Ratio: 2
Triglycerides: 72 mg/dL (ref 0.0–149.0)
VLDL: 14.4 mg/dL (ref 0.0–40.0)

## 2018-03-25 LAB — TSH: TSH: 0.48 u[IU]/mL (ref 0.35–4.50)

## 2018-03-25 NOTE — Progress Notes (Signed)
Subjective:  Patient ID: Elpidio Galea, female    DOB: Nov 16, 1960  Age: 57 y.o. MRN: [id number]  CC: No chief complaint on file.   HPI Zamaya Rapaport Yano presents for a well exam F/u elev creat  Outpatient Medications Prior to Visit  Medication Sig Dispense Refill  . acetaminophen (TYLENOL) 500 MG tablet Take 1,000 mg by mouth as needed for mild pain or headache.    . Cholecalciferol (VITAMIN D3) 1000 UNITS CAPS Take 1,000 Units by mouth daily.     Marland Kitchen ketotifen (ALAWAY) 0.025 % ophthalmic solution Place 1 drop into both eyes as needed (itchy eyes).    . naproxen (NAPROSYN) 500 MG tablet Take 1 tablet (500 mg total) by mouth 2 (two) times daily as needed (use pc). 60 tablet 2  . potassium chloride SA (KLOR-CON M20) 20 MEQ tablet TAKE 1 TABLET BY MOUTH EVERY DAY 30 tablet 11  . SYNTHROID 75 MCG tablet TAKE 1 TABLET BY MOUTH DAILY BEFORE BREAKFAST. (Patient taking differently: Take 75 mcg by mouth daily before breakfast. ) 90 tablet 2  . vitamin E 400 UNIT capsule Take 400 Units by mouth daily.      Marland Kitchen amLODipine (NORVASC) 10 MG tablet Take 1 tablet (10 mg total) by mouth daily. 30 tablet 2   No facility-administered medications prior to visit.     ROS: Review of Systems  Constitutional: Negative for activity change, appetite change, chills, fatigue and unexpected weight change.  HENT: Negative for congestion, mouth sores and sinus pressure.   Eyes: Negative for visual disturbance.  Respiratory: Negative for cough and chest tightness.   Gastrointestinal: Negative for abdominal pain and nausea.  Genitourinary: Negative for difficulty urinating, frequency and vaginal pain.  Musculoskeletal: Negative for back pain and gait problem.  Skin: Negative for pallor and rash.  Neurological: Negative for dizziness, tremors, weakness, numbness and headaches.  Psychiatric/Behavioral: Negative for confusion, sleep disturbance and suicidal ideas.    Objective:  BP 138/86 (BP Location: Left Arm,  Patient Position: Sitting, Cuff Size: Large)   Pulse 69   Temp 98 F (36.7 C) (Oral)   Ht 5' 3.5" (1.613 m)   Wt 178 lb (80.7 kg)   LMP 02/05/2008   SpO2 98%   BMI 31.04 kg/m   BP Readings from Last 3 Encounters:  03/25/18 138/86  03/01/18 134/88  02/19/18 135/89    Wt Readings from Last 3 Encounters:  03/25/18 178 lb (80.7 kg)  03/01/18 181 lb (82.1 kg)  02/19/18 183 lb 6.4 oz (83.2 kg)    Physical Exam  Constitutional: She appears well-developed. No distress.  HENT:  Head: Normocephalic.  Right Ear: External ear normal.  Left Ear: External ear normal.  Nose: Nose normal.  Mouth/Throat: Oropharynx is clear and moist.  Eyes: Pupils are equal, round, and reactive to light. Conjunctivae are normal. Right eye exhibits no discharge. Left eye exhibits no discharge.  Neck: Normal range of motion. Neck supple. No JVD present. No tracheal deviation present. No thyromegaly present.  Cardiovascular: Normal rate, regular rhythm and normal heart sounds.  Pulmonary/Chest: No stridor. No respiratory distress. She has no wheezes.  Abdominal: Soft. Bowel sounds are normal. She exhibits no distension and no mass. There is no tenderness. There is no rebound and no guarding.  Musculoskeletal: She exhibits no edema or tenderness.  Lymphadenopathy:    She has no cervical adenopathy.  Neurological: She displays normal reflexes. No cranial nerve deficit. She exhibits normal muscle tone. Coordination normal.  Skin: No rash  noted. No erythema.  Psychiatric: She has a normal mood and affect. Her behavior is normal. Judgment and thought content normal.    Lab Results  Component Value Date   WBC 6.5 02/06/2018   HGB 11.0 (L) 02/06/2018   HCT 34.1 (L) 02/06/2018   PLT 231 02/06/2018   GLUCOSE 86 02/08/2018   CHOL 128 03/21/2017   TRIG 53.0 03/21/2017   HDL 66.70 03/21/2017   LDLCALC 51 03/21/2017   ALT 12 02/06/2018   AST 23 02/06/2018   NA 141 02/08/2018   K 3.4 (L) 02/08/2018   CL 111  02/08/2018   CREATININE 1.11 (H) 02/08/2018   BUN 11 02/08/2018   CO2 26 02/08/2018   TSH 0.724 02/06/2018    Dg Chest 2 View  Result Date: 02/06/2018 CLINICAL DATA:  Syncopal episode today. EXAM: CHEST - 2 VIEW COMPARISON:  PA and lateral chest 07/20/2010 and 06/07/2008. FINDINGS: The lungs are clear. Heart size is normal. No pneumothorax or pleural effusion. No acute or focal bony abnormality. IMPRESSION: Negative chest. Electronically Signed   By: Drusilla Kanner M.D.   On: 02/06/2018 13:53    Assessment & Plan:   There are no diagnoses linked to this encounter.   No orders of the defined types were placed in this encounter.    Follow-up: No follow-ups on file.  Sonda Primes, MD

## 2018-03-26 LAB — IRON,TIBC AND FERRITIN PANEL
%SAT: 17 % (ref 16–45)
Ferritin: 94 ng/mL (ref 16–232)
Iron: 46 ug/dL (ref 45–160)
TIBC: 275 ug/dL (ref 250–450)

## 2018-03-26 NOTE — Assessment & Plan Note (Signed)
Hydration Labs

## 2018-03-26 NOTE — Assessment & Plan Note (Signed)
BP Readings from Last 3 Encounters:  03/25/18 138/86  03/01/18 134/88  02/19/18 135/89

## 2018-03-26 NOTE — Assessment & Plan Note (Signed)
We discussed age appropriate health related issues, including available/recomended screening tests and vaccinations. We discussed a need for adhering to healthy diet and exercise. Labs/EKG were reviewed/ordered. All questions were answered. Colon 2013 - she had a problem with IV sedation "burning" all over. Due in 2023 Shingrix discussed

## 2018-03-27 ENCOUNTER — Encounter: Payer: Self-pay | Admitting: Internal Medicine

## 2018-03-28 ENCOUNTER — Other Ambulatory Visit: Payer: Self-pay | Admitting: Internal Medicine

## 2018-04-15 ENCOUNTER — Ambulatory Visit
Admission: RE | Admit: 2018-04-15 | Discharge: 2018-04-15 | Disposition: A | Discharge status: Home / Self Care | Payer: BC Managed Care – PPO | Source: Ambulatory Visit | Attending: Internal Medicine | Admitting: Internal Medicine

## 2018-04-15 DIAGNOSIS — R7989 Other specified abnormal findings of blood chemistry: Secondary | ICD-10-CM

## 2018-04-25 ENCOUNTER — Other Ambulatory Visit: Payer: Self-pay | Admitting: Cardiology

## 2018-05-25 ENCOUNTER — Other Ambulatory Visit: Payer: Self-pay | Admitting: Cardiology

## 2018-05-27 NOTE — Telephone Encounter (Signed)
Rx request sent to pharmacy.  

## 2018-06-24 ENCOUNTER — Other Ambulatory Visit: Payer: Self-pay | Admitting: Cardiology

## 2018-07-18 ENCOUNTER — Ambulatory Visit: Payer: BC Managed Care – PPO | Admitting: Internal Medicine

## 2018-07-18 ENCOUNTER — Encounter: Payer: Self-pay | Admitting: Internal Medicine

## 2018-07-18 DIAGNOSIS — R21 Rash and other nonspecific skin eruption: Secondary | ICD-10-CM | POA: Diagnosis not present

## 2018-07-18 MED ORDER — METHYLPREDNISOLONE 4 MG PO TBPK: ORAL_TABLET | ORAL | 0 refills | Status: DC

## 2018-07-18 MED ORDER — TRIAMCINOLONE ACETONIDE 0.1 % EX CREA: 1.0000 "application " | TOPICAL_CREAM | Freq: Two times a day (BID) | CUTANEOUS | 3 refills | Status: DC

## 2018-07-18 NOTE — Progress Notes (Signed)
Subjective:  Patient ID: Tina Rivera, female    DOB: 06-Oct-1960  Age: 58 y.o. MRN: [id number]  CC: No chief complaint on file.   HPI Alannie Portela Sferrazza presents for rash on skin x since Tue No itching   Outpatient Medications Prior to Visit  Medication Sig Dispense Refill  . acetaminophen (TYLENOL) 500 MG tablet Take 1,000 mg by mouth as needed for mild pain or headache.    Marland Kitchen amLODipine (NORVASC) 10 MG tablet TAKE 1 TABLET BY MOUTH EVERY DAY 30 tablet 2  . Cholecalciferol (VITAMIN D3) 1000 UNITS CAPS Take 1,000 Units by mouth daily.     Marland Kitchen ketotifen (ALAWAY) 0.025 % ophthalmic solution Place 1 drop into both eyes as needed (itchy eyes).    . naproxen (NAPROSYN) 500 MG tablet Take 1 tablet (500 mg total) by mouth 2 (two) times daily as needed (use pc). 60 tablet 2  . potassium chloride SA (KLOR-CON M20) 20 MEQ tablet TAKE 1 TABLET BY MOUTH EVERY DAY 30 tablet 11  . SYNTHROID 75 MCG tablet TAKE 1 TABLET BY MOUTH DAILY BEFORE BREAKFAST. (Patient taking differently: Take 75 mcg by mouth daily before breakfast. ) 90 tablet 2  . vitamin E 400 UNIT capsule Take 400 Units by mouth daily.       No facility-administered medications prior to visit.     ROS: Review of Systems  Constitutional: Negative for activity change, appetite change, chills, fatigue, fever and unexpected weight change.  HENT: Negative for congestion, mouth sores and sinus pressure.   Eyes: Negative for visual disturbance.  Respiratory: Negative for cough and chest tightness.   Gastrointestinal: Negative for abdominal pain and nausea.  Genitourinary: Negative for difficulty urinating, frequency and vaginal pain.  Musculoskeletal: Negative for back pain and gait problem.  Skin: Positive for rash. Negative for pallor.  Neurological: Negative for dizziness, tremors, weakness, numbness and headaches.  Psychiatric/Behavioral: Negative for confusion and sleep disturbance.    Objective:  BP 132/88 (BP Location: Left Arm,  Patient Position: Sitting, Cuff Size: Large)   Pulse 94   Temp 97.7 F (36.5 C) (Oral)   Ht 5' 3.5" (1.613 m)   Wt 179 lb (81.2 kg)   LMP 02/05/2008   SpO2 98%   BMI 31.21 kg/m   BP Readings from Last 3 Encounters:  07/18/18 132/88  03/25/18 138/86  03/01/18 134/88    Wt Readings from Last 3 Encounters:  07/18/18 179 lb (81.2 kg)  03/25/18 178 lb (80.7 kg)  03/01/18 181 lb (82.1 kg)    Physical Exam Constitutional:      General: She is not in acute distress.    Appearance: She is well-developed.  HENT:     Head: Normocephalic.     Right Ear: External ear normal.     Left Ear: External ear normal.     Nose: Nose normal.  Eyes:     General:        Right eye: No discharge.        Left eye: No discharge.     Conjunctiva/sclera: Conjunctivae normal.     Pupils: Pupils are equal, round, and reactive to light.  Neck:     Musculoskeletal: Normal range of motion and neck supple.     Thyroid: No thyromegaly.     Vascular: No JVD.     Trachea: No tracheal deviation.  Cardiovascular:     Rate and Rhythm: Normal rate and regular rhythm.     Heart sounds: Normal heart sounds.  Pulmonary:     Effort: No respiratory distress.     Breath sounds: No stridor. No wheezing.  Abdominal:     General: Bowel sounds are normal. There is no distension.     Palpations: Abdomen is soft. There is no mass.     Tenderness: There is no abdominal tenderness. There is no guarding or rebound.  Musculoskeletal:        General: No tenderness.  Lymphadenopathy:     Cervical: No cervical adenopathy.  Skin:    Findings: Rash present. No erythema.  Neurological:     Cranial Nerves: No cranial nerve deficit.     Motor: No abnormal muscle tone.     Coordination: Coordination normal.     Deep Tendon Reflexes: Reflexes normal.  Psychiatric:        Behavior: Behavior normal.        Thought Content: Thought content normal.        Judgment: Judgment normal.    Christmas tree distribution papular  eryth rash on chest w/a herald patch on the R breast  >20 min FTF discussing rash etiology, treatment   Lab Results  Component Value Date   WBC 5.6 03/25/2018   HGB 11.9 (L) 03/25/2018   HCT 35.4 (L) 03/25/2018   PLT 284.0 03/25/2018   GLUCOSE 91 03/25/2018   CHOL 132 03/25/2018   TRIG 72.0 03/25/2018   HDL 61.30 03/25/2018   LDLCALC 57 03/25/2018   ALT 12 03/25/2018   AST 18 03/25/2018   NA 139 03/25/2018   K 3.8 03/25/2018   CL 105 03/25/2018   CREATININE 1.08 03/25/2018   BUN 15 03/25/2018   CO2 25 03/25/2018   TSH 0.48 03/25/2018    US Renal  Result Date: 04/15/2018 CLINICAL DATA:  Elevated creatinine. EXAM: RENAL / URINARY TRACT ULTRASOUND COMPLETE COMPARISON:  CT abdomen pelvis dated August 11, 2004. FINDINGS: Right Kidney: Length: 9.2 cm. Echogenicity within normal limits. No mass or hydronephrosis visualized. Left Kidney: Length: 8.4 cm. Echogenicity within normal limits. No mass or hydronephrosis visualized. Bladder: Appears normal for degree of bladder distention. IMPRESSION: Normal renal ultrasound. Electronically Signed   By: Obie Dredge M.D.   On: 04/15/2018 17:04    Assessment & Plan:   There are no diagnoses linked to this encounter.   No orders of the defined types were placed in this encounter.    Follow-up: No follow-ups on file.  Sonda Primes, MD

## 2018-07-18 NOTE — Assessment & Plan Note (Signed)
?  pityriasis rosea Info Triamc cream Medrol pack if worse

## 2018-07-18 NOTE — Patient Instructions (Signed)
Pityriasis Rosea  Pityriasis rosea is a rash that usually appears on the chest, abdomen, and back. It may also appear on the upper arms and upper legs. It usually begins as a single patch, and then more patches start to develop. The rash may cause mild itching, but it normally does not cause other problems. It usually goes away without treatment. However, it may take weeks or months for the rash to go away completely.  What are the causes?  The cause of this condition is not known. The condition does not spread from person to person (is not contagious).  What increases the risk?  This condition is more likely to develop in:  · Persons aged 10-35 years.  · Pregnant women.  It is more common in the spring and fall seasons.  What are the signs or symptoms?  The main symptom of this condition is a rash.  · The rash usually begins with a single oval patch that is larger than the ones that follow. This is called a herald patch. It generally appears a week or more before the rest of the rash appears.  · When more patches start to develop, they spread quickly on the chest, abdomen, back, arms, and legs. These patches are smaller than the first one.  · The patches that make up the rash are usually oval-shaped and pink or red in color. They are usually flat but may sometimes be raised so that they can be felt with a finger. They may also be finely crinkled and have a scaly ring around the edge.  Some people may have mild itching and nonspecific symptoms, such as:  · Nausea.  · Loss of appetite.  · Difficulty concentrating.  · Headache.  · Irritability.  · Sore throat.  · Mild fever.  How is this diagnosed?  This condition may be diagnosed based on:  · Your medical history and a physical exam.  · Tests to rule out other causes. This may include blood tests or a test in which a small sample of skin is removed from the rash (biopsy) and checked in a lab.  How is this treated?         Treatment is not usually needed for this  condition. The rash will often go away on its own in 4-8 weeks. In some cases, a health care provider may recommend or prescribe medicine to reduce itching.  Follow these instructions at home:  · Take or apply over-the-counter and prescription medicines only as told by your health care provider.  · Avoid scratching the affected areas of skin.  · Do not take hot baths or use a sauna. Use only warm water when bathing or showering. Heat can increase itching. Adding cornstarch to your bath may help to relieve the itching.  · Avoid exposure to the sun and other sources of UV light, such as tanning beds, as told by your health care provider. UV light may help the rash go away but may cause unwanted changes in skin color.  · Keep all follow-up visits as told by your health care provider. This is important.  Contact a health care provider if:  · Your rash does not go away in 8 weeks.  · Your rash gets much worse.  · You have a fever.  · You have swelling or pain in the rash area.  · You have fluid, blood, or pus coming from the rash area.  Summary  · Pityriasis rosea is a rash that   usually appears on the trunk of the body. It can also appear on the upper arms and upper legs.  · The rash usually begins with a single oval patch (herald patch) that appears a week or more before the rest of the rash appears. The herald patch is larger than the ones that follow.  · The rash may cause mild itching, but it usually does not cause other problems. It usually goes away without treatment in 4-8 weeks.  · In some cases, a health care provider may recommend or prescribe medicine to reduce itching.  This information is not intended to replace advice given to you by your health care provider. Make sure you discuss any questions you have with your health care provider.  Document Released: 07/12/2001 Document Revised: 06/04/2017 Document Reviewed: 06/04/2017  Elsevier Interactive Patient Education © 2019 [employer]

## 2018-08-23 ENCOUNTER — Ambulatory Visit: Payer: Self-pay
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
## 2018-08-26 ENCOUNTER — Ambulatory Visit: Payer: BC Managed Care – PPO | Admitting: Internal Medicine

## 2018-08-26 ENCOUNTER — Other Ambulatory Visit: Payer: Self-pay | Admitting: Internal Medicine

## 2018-08-26 ENCOUNTER — Encounter: Payer: Self-pay | Admitting: Internal Medicine

## 2018-08-26 DIAGNOSIS — I10 Essential (primary) hypertension: Secondary | ICD-10-CM

## 2018-08-26 MED ORDER — HYDROCHLOROTHIAZIDE 12.5 MG PO CAPS: 12.5000 mg | ORAL_CAPSULE | Freq: Every day | ORAL | 1 refills | Status: DC
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
## 2018-08-26 NOTE — Assessment & Plan Note (Signed)
Flare currently due to stressor and poor sleep. Taking amlodipine 10 mg daily and adding hctz 12.5 mg daily and she should see PCP within a month to assess for need for long term treatment for this.

## 2018-08-26 NOTE — Patient Instructions (Signed)
We have sent in the microzide to take 1 pill daily.   Come back and see your doctor in about 1 month to check in.

## 2018-08-27 NOTE — Telephone Encounter (Signed)
Pt seen Dr. Okey Dupre yesterday

## 2018-10-18 ENCOUNTER — Other Ambulatory Visit: Payer: Self-pay | Admitting: Internal Medicine

## 2018-11-24 ENCOUNTER — Other Ambulatory Visit: Payer: Self-pay | Admitting: Internal Medicine

## 2018-12-19 ENCOUNTER — Other Ambulatory Visit: Payer: Self-pay | Admitting: Internal Medicine

## 2018-12-21 ENCOUNTER — Other Ambulatory Visit: Payer: Self-pay | Admitting: Cardiology

## 2019-02-27 ENCOUNTER — Other Ambulatory Visit: Payer: Self-pay | Admitting: Internal Medicine

## 2019-03-28 ENCOUNTER — Other Ambulatory Visit: Payer: Self-pay | Admitting: Internal Medicine

## 2019-03-31 ENCOUNTER — Ambulatory Visit (INDEPENDENT_AMBULATORY_CARE_PROVIDER_SITE_OTHER): Payer: BC Managed Care – PPO | Admitting: Internal Medicine

## 2019-03-31 ENCOUNTER — Other Ambulatory Visit (INDEPENDENT_AMBULATORY_CARE_PROVIDER_SITE_OTHER): Payer: BC Managed Care – PPO

## 2019-03-31 ENCOUNTER — Other Ambulatory Visit: Payer: Self-pay

## 2019-03-31 ENCOUNTER — Encounter: Payer: Self-pay | Admitting: Internal Medicine

## 2019-03-31 ENCOUNTER — Other Ambulatory Visit: Payer: BC Managed Care – PPO

## 2019-03-31 VITALS — BP 154/96 | HR 79 | Temp 98.3°F | Ht 63.5 in | Wt 181.0 lb

## 2019-03-31 DIAGNOSIS — Z23 Encounter for immunization: Secondary | ICD-10-CM

## 2019-03-31 DIAGNOSIS — I10 Essential (primary) hypertension: Secondary | ICD-10-CM | POA: Diagnosis not present

## 2019-03-31 DIAGNOSIS — R748 Abnormal levels of other serum enzymes: Secondary | ICD-10-CM | POA: Insufficient documentation

## 2019-03-31 DIAGNOSIS — R8290 Unspecified abnormal findings in urine: Secondary | ICD-10-CM

## 2019-03-31 DIAGNOSIS — Z0000 Encounter for general adult medical examination without abnormal findings: Secondary | ICD-10-CM

## 2019-03-31 DIAGNOSIS — E034 Atrophy of thyroid (acquired): Secondary | ICD-10-CM | POA: Diagnosis not present

## 2019-03-31 LAB — HEPATIC FUNCTION PANEL
ALT: 15 U/L (ref 0–35)
AST: 18 U/L (ref 0–37)
Albumin: 4.4 g/dL (ref 3.5–5.2)
Alkaline Phosphatase: 132 U/L — ABNORMAL HIGH (ref 39–117)
Bilirubin, Direct: 0.1 mg/dL (ref 0.0–0.3)
Total Bilirubin: 0.4 mg/dL (ref 0.2–1.2)
Total Protein: 7.8 g/dL (ref 6.0–8.3)

## 2019-03-31 LAB — URINALYSIS, ROUTINE W REFLEX MICROSCOPIC
Bilirubin Urine: NEGATIVE
Hgb urine dipstick: NEGATIVE
Ketones, ur: NEGATIVE
Nitrite: NEGATIVE
RBC / HPF: NONE SEEN (ref 0–?)
Specific Gravity, Urine: <=1.005 — AB (ref 1.000–1.030)
Total Protein, Urine: NEGATIVE
Urine Glucose: NEGATIVE
Urobilinogen, UA: 0.2 (ref 0.0–1.0)
pH: 6.5 (ref 5.0–8.0)

## 2019-03-31 LAB — BASIC METABOLIC PANEL
BUN: 10 mg/dL (ref 6–23)
CO2: 27 mEq/L (ref 19–32)
Calcium: 10 mg/dL (ref 8.4–10.5)
Chloride: 103 mEq/L (ref 96–112)
Creatinine, Ser: 1.04 mg/dL (ref 0.40–1.20)
GFR: 66.01 mL/min (ref >60.00)
Glucose, Bld: 97 mg/dL (ref 70–99)
Potassium: 3.9 mEq/L (ref 3.5–5.1)
Sodium: 140 mEq/L (ref 135–145)

## 2019-03-31 LAB — LIPID PANEL
Cholesterol: 147 mg/dL (ref 0–200)
HDL: 81.9 mg/dL (ref >39.00)
LDL Cholesterol: 51 mg/dL (ref 0–99)
NonHDL: 64.88
Total CHOL/HDL Ratio: 2
Triglycerides: 71 mg/dL (ref 0.0–149.0)
VLDL: 14.2 mg/dL (ref 0.0–40.0)

## 2019-03-31 LAB — CBC WITH DIFFERENTIAL/PLATELET
Basophils Absolute: 0.1 10*3/uL (ref 0.0–0.1)
Basophils Relative: 0.9 % (ref 0.0–3.0)
Eosinophils Absolute: 0.2 10*3/uL (ref 0.0–0.7)
Eosinophils Relative: 2.7 % (ref 0.0–5.0)
HCT: 39.6 % (ref 36.0–46.0)
Hemoglobin: 13 g/dL (ref 12.0–15.0)
Lymphocytes Relative: 41.6 % (ref 12.0–46.0)
Lymphs Abs: 2.6 10*3/uL (ref 0.7–4.0)
MCHC: 32.9 g/dL (ref 30.0–36.0)
MCV: 81.8 fl (ref 78.0–100.0)
Monocytes Absolute: 0.5 10*3/uL (ref 0.1–1.0)
Monocytes Relative: 8.4 % (ref 3.0–12.0)
Neutro Abs: 2.9 10*3/uL (ref 1.4–7.7)
Neutrophils Relative %: 46.4 % (ref 43.0–77.0)
Platelets: 280 10*3/uL (ref 150.0–400.0)
RBC: 4.85 Mil/uL (ref 3.87–5.11)
RDW: 15.4 % (ref 11.5–15.5)
WBC: 6.3 10*3/uL (ref 4.0–10.5)

## 2019-03-31 LAB — TSH: TSH: 2.58 u[IU]/mL (ref 0.35–4.50)

## 2019-03-31 MED ORDER — POTASSIUM CHLORIDE CRYS ER 20 MEQ PO TBCR: EXTENDED_RELEASE_TABLET | ORAL | 3 refills | Status: DC

## 2019-03-31 MED ORDER — SYNTHROID 75 MCG PO TABS: ORAL_TABLET | ORAL | 11 refills | Status: DC

## 2019-03-31 MED ORDER — AMLODIPINE BESYLATE 10 MG PO TABS: 10.0000 mg | ORAL_TABLET | Freq: Every day | ORAL | 3 refills | Status: DC

## 2019-03-31 MED ORDER — HYDROCHLOROTHIAZIDE 12.5 MG PO CAPS: ORAL_CAPSULE | ORAL | 3 refills | Status: DC

## 2019-03-31 NOTE — Assessment & Plan Note (Signed)
We discussed age appropriate health related issues, including available/recomended screening tests and vaccinations. We discussed a need for adhering to healthy diet and exercise. Labs were ordered to be later reviewed . All questions were answered.   

## 2019-03-31 NOTE — Progress Notes (Signed)
Subjective:  Patient ID: Tina Rivera, female    DOB: February 05, 1961  Age: 58 y.o. MRN: [id number]  CC: No chief complaint on file.   HPI Tina Rivera presents for a well exam BP OK at home.  Follow-up on hypertension, hypothyroidism  Outpatient Medications Prior to Visit  Medication Sig Dispense Refill  . acetaminophen (TYLENOL) 500 MG tablet Take 1,000 mg by mouth as needed for mild pain or headache.    Marland Kitchen amLODipine (NORVASC) 10 MG tablet TAKE 1 TABLET BY MOUTH EVERY DAY 30 tablet 6  . Cholecalciferol (VITAMIN D3) 1000 UNITS CAPS Take 1,000 Units by mouth daily.     . hydrochlorothiazide (MICROZIDE) 12.5 MG capsule TAKE 1 CAPSULE BY MOUTH EVERY DAY 30 capsule 11  . ketotifen (ALAWAY) 0.025 % ophthalmic solution Place 1 drop into both eyes as needed (itchy eyes).    . naproxen (NAPROSYN) 500 MG tablet Take 1 tablet (500 mg total) by mouth 2 (two) times daily as needed (use pc). 60 tablet 2  . potassium chloride SA (KLOR-CON M20) 20 MEQ tablet TAKE 1 TABLET BY MOUTH EVERY DAY 90 tablet 1  . SYNTHROID 75 MCG tablet TAKE 1 TABLET DAILY BEFORE BREAKFAST. PATIENT NEEDS OFFICE VISIT BEFORE REFILLS WILL BE GIVEN 30 tablet 11  . triamcinolone cream (KENALOG) 0.1 % Apply 1 application topically 2 (two) times daily. 80 g 3  . vitamin E 400 UNIT capsule Take 400 Units by mouth daily.       No facility-administered medications prior to visit.     ROS: Review of Systems  Constitutional: Negative for activity change, appetite change, chills, fatigue and unexpected weight change.  HENT: Negative for congestion, mouth sores and sinus pressure.   Eyes: Negative for visual disturbance.  Respiratory: Negative for cough and chest tightness.   Gastrointestinal: Negative for abdominal pain and nausea.  Genitourinary: Negative for difficulty urinating, frequency and vaginal pain.  Musculoskeletal: Negative for back pain and gait problem.  Skin: Negative for pallor and rash.  Neurological:  Negative for dizziness, tremors, weakness, numbness and headaches.  Psychiatric/Behavioral: Negative for confusion and sleep disturbance. The patient is not nervous/anxious.     Objective:  BP (!) 154/96 (BP Location: Left Arm, Patient Position: Sitting, Cuff Size: Large)   Pulse 79   Temp 98.3 F (36.8 C) (Oral)   Ht 5' 3.5" (1.613 m)   Wt 181 lb (82.1 kg)   LMP 02/05/2008   SpO2 97%   BMI 31.56 kg/m   BP Readings from Last 3 Encounters:  03/31/19 (!) 154/96  08/26/18 (!) 170/110  07/18/18 132/88    Wt Readings from Last 3 Encounters:  03/31/19 181 lb (82.1 kg)  08/26/18 181 lb (82.1 kg)  07/18/18 179 lb (81.2 kg)    Physical Exam Constitutional:      General: She is not in acute distress.    Appearance: She is well-developed.  HENT:     Head: Normocephalic.     Right Ear: External ear normal.     Left Ear: External ear normal.     Nose: Nose normal.  Eyes:     General:        Right eye: No discharge.        Left eye: No discharge.     Conjunctiva/sclera: Conjunctivae normal.     Pupils: Pupils are equal, round, and reactive to light.  Neck:     Musculoskeletal: Normal range of motion and neck supple.     Thyroid: No  thyromegaly.     Vascular: No JVD.     Trachea: No tracheal deviation.  Cardiovascular:     Rate and Rhythm: Normal rate and regular rhythm.     Heart sounds: Normal heart sounds.  Pulmonary:     Effort: No respiratory distress.     Breath sounds: No stridor. No wheezing.  Abdominal:     General: Bowel sounds are normal. There is no distension.     Palpations: Abdomen is soft. There is no mass.     Tenderness: There is no abdominal tenderness. There is no guarding or rebound.  Musculoskeletal:        General: No tenderness.  Lymphadenopathy:     Cervical: No cervical adenopathy.  Skin:    Findings: No erythema or rash.  Neurological:     Cranial Nerves: No cranial nerve deficit.     Motor: No abnormal muscle tone.     Coordination:  Coordination normal.     Deep Tendon Reflexes: Reflexes normal.  Psychiatric:        Behavior: Behavior normal.        Thought Content: Thought content normal.        Judgment: Judgment normal.     Lab Results  Component Value Date   WBC 6.3 03/31/2019   HGB 13.0 03/31/2019   HCT 39.6 03/31/2019   PLT 280.0 03/31/2019   GLUCOSE 97 03/31/2019   CHOL 147 03/31/2019   TRIG 71.0 03/31/2019   HDL 81.90 03/31/2019   LDLCALC 51 03/31/2019   ALT 15 03/31/2019   AST 18 03/31/2019   NA 140 03/31/2019   K 3.9 03/31/2019   CL 103 03/31/2019   CREATININE 1.04 03/31/2019   BUN 10 03/31/2019   CO2 27 03/31/2019   TSH 2.58 03/31/2019    US Renal  Result Date: 04/15/2018 CLINICAL DATA:  Elevated creatinine. EXAM: RENAL / URINARY TRACT ULTRASOUND COMPLETE COMPARISON:  CT abdomen pelvis dated August 11, 2004. FINDINGS: Right Kidney: Length: 9.2 cm. Echogenicity within normal limits. No mass or hydronephrosis visualized. Left Kidney: Length: 8.4 cm. Echogenicity within normal limits. No mass or hydronephrosis visualized. Bladder: Appears normal for degree of bladder distention. IMPRESSION: Normal renal ultrasound. Electronically Signed   By: Obie Dredge M.D.   On: 04/15/2018 17:04    Assessment & Plan:   There are no diagnoses linked to this encounter.   No orders of the defined types were placed in this encounter.    Follow-up: No follow-ups on file.  Sonda Primes, MD

## 2019-03-31 NOTE — Patient Instructions (Addendum)
These suggestions will probably help you to improve your metabolism if you are not overweight and to lose weight if you are overweight: 1.  Reduce your consumption of sugars and starches.  Eliminate high fructose corn syrup from your diet.  Reduce your consumption of processed foods.  For desserts try to have seasonal fruits, berries, nuts, cheeses or dark chocolate with more than 70% cacao. 2.  Do not snack 3.  You do not have to eat breakfast.  If you choose to have breakfast-eat plain greek yogurt, eggs, oatmeal (without sugar) 4.  Drink water, freshly brewed unsweetened tea (green, black or herbal) or coffee.  Do not drink sodas including diet sodas , juices, beverages sweetened with artificial sweeteners. 5.  Reduce your consumption of refined grains. 6.  Avoid protein drinks such as Optifast, Slim fast etc. Eat chicken, fish, meat, dairy and beans for your sources of protein 7.  Natural unprocessed fats like cold pressed virgin olive oil, butter, coconut oil are good for you.  Eat avocados 8.  Increase your consumption of fiber.  Fruits, berries, vegetables, whole grains, flaxseeds, Chia seeds, beans, popcorn, nuts, oatmeal are good sources of fiber 9.  Use vinegar in your diet, i.e. apple cider vinegar, red wine or balsamic vinegar 10.  You can try fasting.  For example you can skip breakfast and lunch every other day (24-hour fast) 11.  Stress reduction, good night sleep, relaxation, meditation, yoga and other physical activity is likely to help you to maintain low weight too. 12.  If you drink alcohol, limit your alcohol intake to no more than 2 drinks a day.   Mediterranean diet is good for you. ([employer] has a typical Mediterranean cuisine menu) The Mediterranean diet is a way of eating based on the traditional cuisine of countries bordering the [place] Sea. While there is no single definition of the Mediterranean diet, it is typically high in vegetables, fruits, whole grains,  beans, nut and seeds, and olive oil. The main components of Mediterranean diet include: . Daily consumption of vegetables, fruits, whole grains and healthy fats  . Weekly intake of fish, poultry, beans and eggs  . Moderate portions of dairy products  . Limited intake of red meat Other important elements of the Mediterranean diet are sharing meals with family and friends, enjoying a glass of red wine and being physically active. Health benefits of a Mediterranean diet: A traditional Mediterranean diet consisting of large quantities of fresh fruits and vegetables, nuts, fish and olive oil-coupled with physical activity-can reduce your risk of serious mental and physical health problems by: Preventing heart disease and strokes. Following a Mediterranean diet limits your intake of refined breads, processed foods, and red meat, and encourages drinking red wine instead of hard liquor-all factors that can help prevent heart disease and stroke. Keeping you agile. If you're an older adult, the nutrients gained with a Mediterranean diet may reduce your risk of developing muscle weakness and other signs of frailty by about 70 percent. Reducing the risk of Alzheimer's. Research suggests that the Mediterranean diet may improve cholesterol, blood sugar levels, and overall blood vessel health, which in turn may reduce your risk of Alzheimer's disease or dementia. Halving the risk of Parkinson's disease. The high levels of antioxidants in the Mediterranean diet can prevent cells from undergoing a damaging process called oxidative stress, thereby cutting the risk of Parkinson's disease in half. Increasing longevity. By reducing your risk of developing heart disease or cancer with the Mediterranean diet,   you're reducing your risk of death at any age by 20%. Protecting against type 2 diabetes. A Mediterranean diet is rich in fiber which digests slowly, prevents huge swings in blood sugar, and can help you maintain a  healthy weight.    Cabbage soup recipe that will not make you gain weight: Take 1 small head of cabbage, 1 average pack of celery, 4 green peppers, 4 onions, 2 cans diced tomatoes (they are not available without salt), salt and spices to taste.  Chop cabbage, celery, peppers and onions.  And tomatoes and 2-2.5 liters (2.5 quarts) of water so that it would just cover the vegetables.  Bring to boil.  Add spices and salt.  Turn heat to low/medium and simmer for 20-25 minutes.  Naturally, you can make a smaller batch and change some of the ingredients.    Cardiac CT calcium scoring test $150   Computed tomography, more commonly known as a CT or CAT scan, is a diagnostic medical imaging test. Like traditional x-rays, it produces multiple images or pictures of the inside of the body. The cross-sectional images generated during a CT scan can be reformatted in multiple planes. They can even generate three-dimensional images. These images can be viewed on a computer monitor, printed on film or by a 3D printer, or transferred to a CD or DVD. CT images of internal organs, bones, soft tissue and blood vessels provide greater detail than traditional x-rays, particularly of soft tissues and blood vessels. A cardiac CT scan for coronary calcium is a non-invasive way of obtaining information about the presence, location and extent of calcified plaque in the coronary arteries-the vessels that supply oxygen-containing blood to the heart muscle. Calcified plaque results when there is a build-up of fat and other substances under the inner layer of the artery. This material can calcify which signals the presence of atherosclerosis, a disease of the vessel wall, also called coronary artery disease (CAD). People with this disease have an increased risk for heart attacks. In addition, over time, progression of plaque build up (CAD) can narrow the arteries or even close off blood flow to the heart. The result may be chest  pain, sometimes called "angina," or a heart attack. Because calcium is a marker of CAD, the amount of calcium detected on a cardiac CT scan is a helpful prognostic tool. The findings on cardiac CT are expressed as a calcium score. Another name for this test is coronary artery calcium scoring.  What are some common uses of the procedure? The goal of cardiac CT scan for calcium scoring is to determine if CAD is present and to what extent, even if there are no symptoms. It is a screening study that may be recommended by a physician for patients with risk factors for CAD but no clinical symptoms. The major risk factors for CAD are: . high blood cholesterol levels  . family history of heart attacks  . diabetes  . high blood pressure  . cigarette smoking  . overweight or obese  . physical inactivity   A negative cardiac CT scan for calcium scoring shows no calcification within the coronary arteries. This suggests that CAD is absent or so minimal it cannot be seen by this technique. The chance of having a heart attack over the next two to five years is very low under these circumstances. A positive test means that CAD is present, regardless of whether or not the patient is experiencing any symptoms. The amount of calcification-expressed as the calcium   to predict the likelihood of a myocardial infarction (heart attack) in the coming years and helps your medical doctor or cardiologist decide whether the patient may need to take preventive medicine or undertake other measures such as diet and exercise to lower the risk for heart attack. The extent of CAD is graded according to your calcium score:  Calcium Score  Presence of CAD (coronary artery disease)  0 No evidence of CAD   1-10 Minimal evidence of CAD  11-100 Mild evidence of CAD  101-400 Moderate evidence of CAD  Over 400 Extensive evidence of CAD   It is also which however the

## 2019-03-31 NOTE — Assessment & Plan Note (Signed)
elev ALK P RUQ [place]

## 2019-04-01 DIAGNOSIS — R8290 Unspecified abnormal findings in urine: Secondary | ICD-10-CM | POA: Insufficient documentation

## 2019-04-01 NOTE — Assessment & Plan Note (Signed)
Continue with Norvasc 10 mg daily

## 2019-04-01 NOTE — Assessment & Plan Note (Signed)
Obtain urine culture

## 2019-04-01 NOTE — Assessment & Plan Note (Signed)
TSH Continue with Levothroid

## 2019-04-02 LAB — URINE CULTURE
MICRO NUMBER:: 979185
SPECIMEN QUALITY:: ADEQUATE

## 2019-04-05 ENCOUNTER — Other Ambulatory Visit: Payer: Self-pay | Admitting: Internal Medicine

## 2019-04-05 MED ORDER — CEPHALEXIN 500 MG PO CAPS: 500.0000 mg | ORAL_CAPSULE | Freq: Three times a day (TID) | ORAL | 0 refills | Status: DC

## 2019-04-21 ENCOUNTER — Encounter: Payer: Self-pay | Admitting: Internal Medicine

## 2019-04-30 ENCOUNTER — Encounter: Payer: Self-pay | Admitting: Internal Medicine

## 2019-05-02 ENCOUNTER — Ambulatory Visit
Admission: RE | Admit: 2019-05-02 | Discharge: 2019-05-02 | Disposition: A | Discharge status: Home / Self Care | Payer: BC Managed Care – PPO | Source: Ambulatory Visit | Attending: Internal Medicine | Admitting: Internal Medicine

## 2019-05-02 DIAGNOSIS — R748 Abnormal levels of other serum enzymes: Secondary | ICD-10-CM

## 2019-09-16 LAB — HM PAP SMEAR: HM Pap smear: NORMAL

## 2019-09-16 LAB — HM MAMMOGRAPHY

## 2019-10-17 ENCOUNTER — Encounter: Payer: Self-pay | Admitting: Internal Medicine

## 2020-01-23 IMAGING — CR DG CHEST 2V
2 series · 2 of 2 positions shown · non-contrast
Comparison: PA and lateral chest 07/20/2010 and 06/07/2008.

CLINICAL DATA: Syncopal episode today.

EXAM:
CHEST - 2 VIEW

[chest pa]
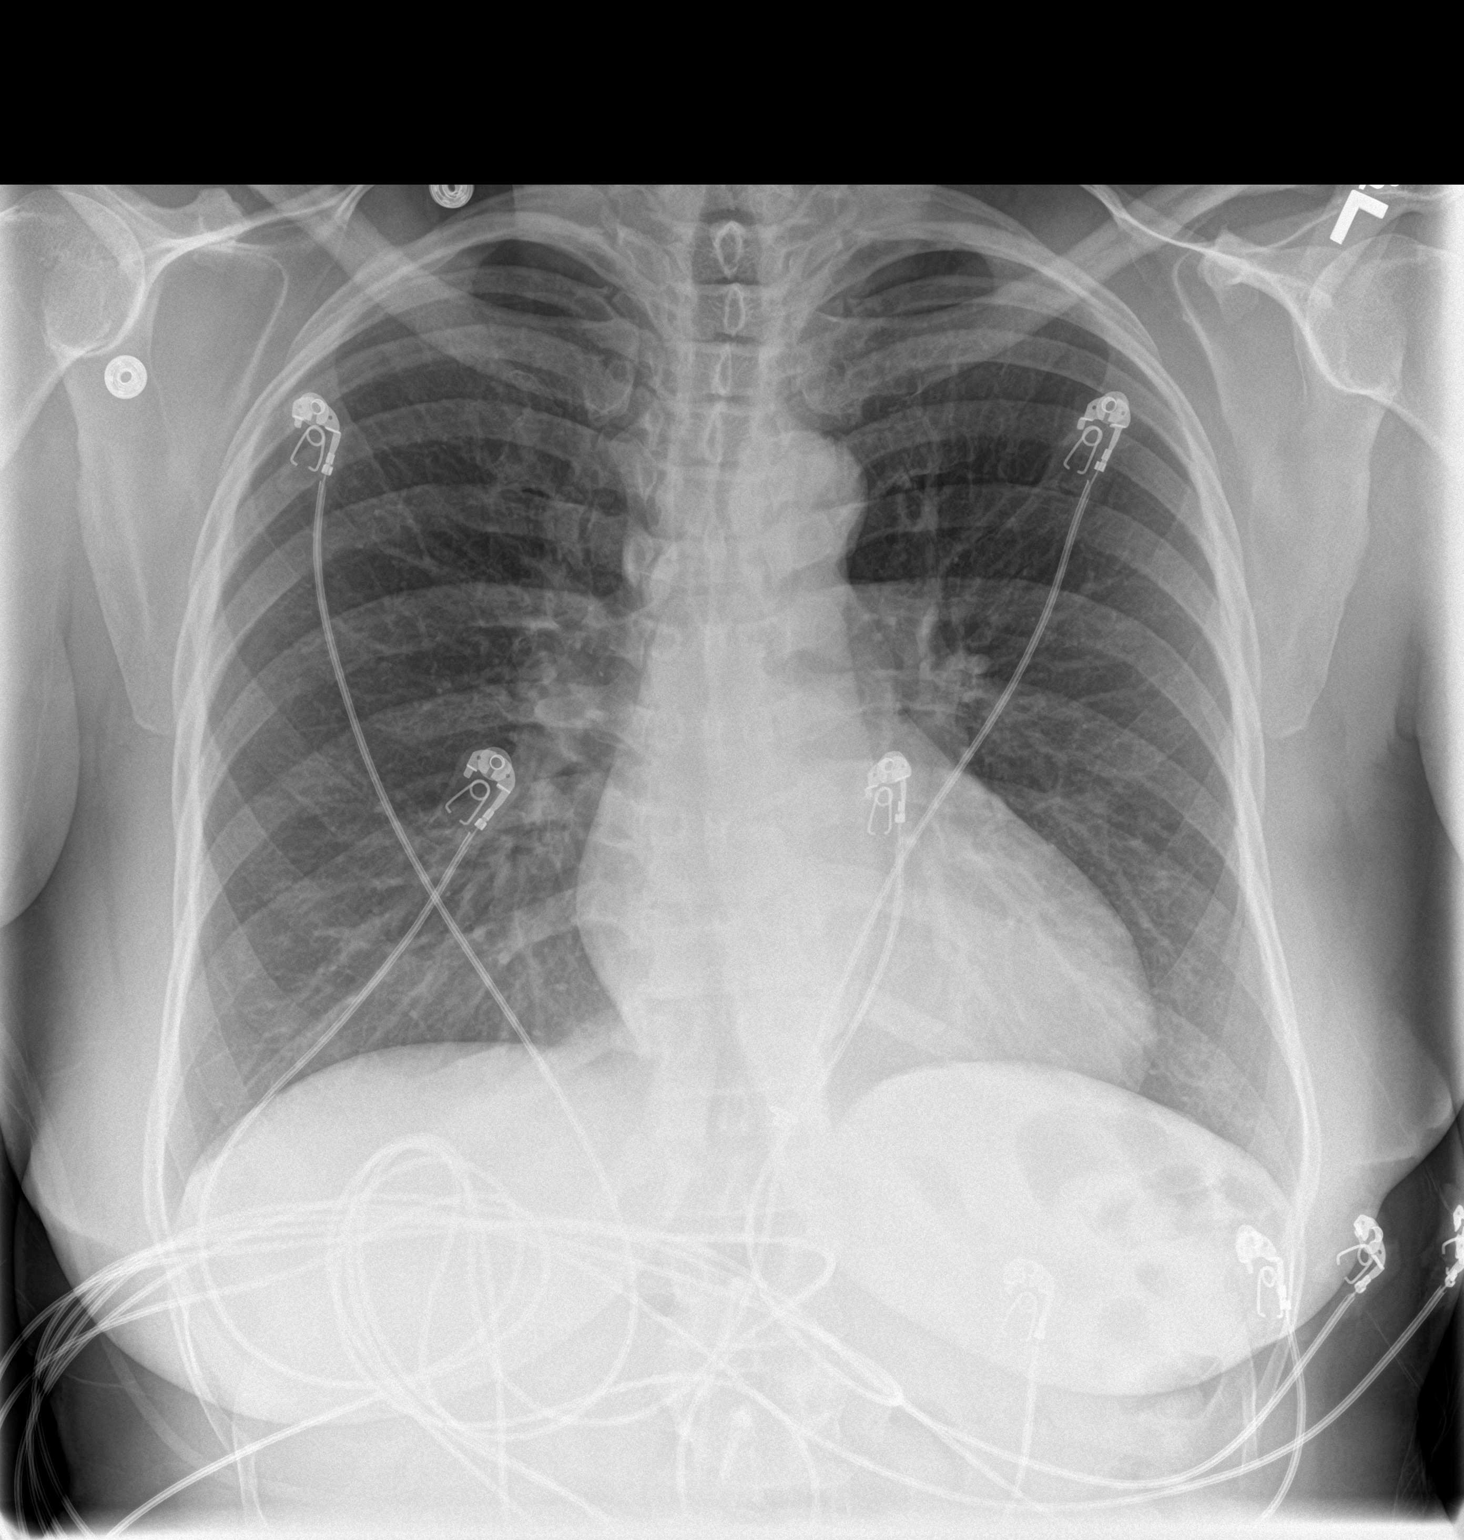

[chest lat]
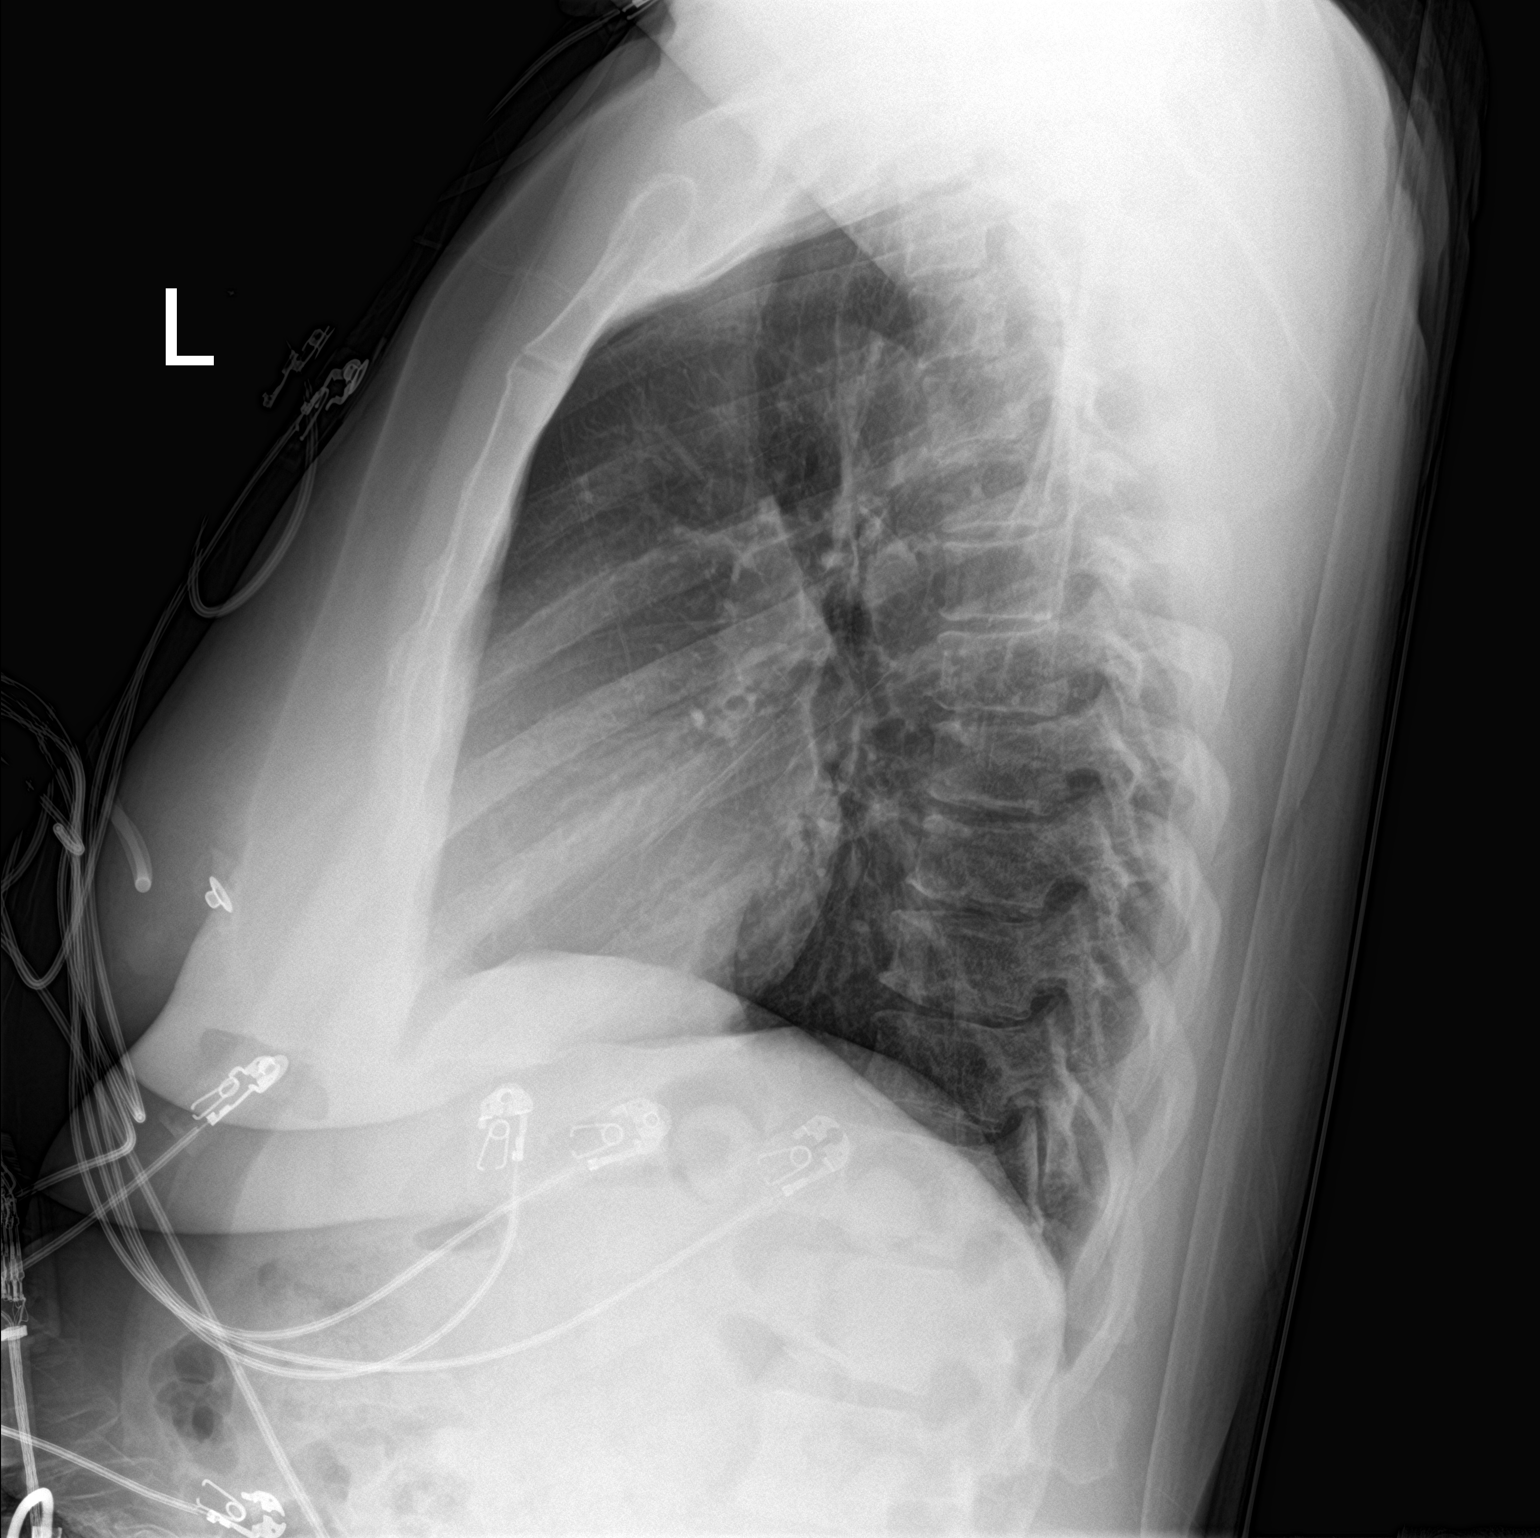

[2 of 2 positions shown; findings below may reference images not displayed]

FINDINGS: The lungs are clear. Heart size is normal. No pneumothorax or
pleural effusion. No acute or focal bony abnormality.
IMPRESSION: Negative chest.

## 2020-03-09 NOTE — Progress Notes (Signed)
Subjective:    Patient ID: Tina Rivera, female    DOB: Jul 19, 1960, 58 y.o.   MRN: [id number]  HPI The patient is here for an acute visit.   Right knee and lower leg tenderness and swelling: she hurted her right leg 3 months ago - it went through a box spring and it bruised and swelled her right medial proximal leg.  It was ok after that.  It is minimally tender to touch only.   She has jumped rope recently.  A few days later it stared.  She noticed the pain about one week or so ago. The pain is behind the knee. occ she has pain in the front of her knee.  A few days later the leg looked swollen - just proxmal to the knee and below the knee.    It is sore behind the knee when walking and the knee pops with walking at time up getting up and down which is painful.  The knee feels a little weak like it might give out on her.    No h/o of knee issues.      Medications and allergies reviewed with patient and updated if appropriate.  Patient Active Problem List   Diagnosis Date Noted   Abnormal urinalysis 04/01/2019   Elevated liver enzymes 03/31/2019   Rash of body 07/18/2018   Creatinine elevation 03/01/2018   Anemia 03/01/2018   Near syncope 02/06/2018   Bradycardia 02/06/2018   Hand pain, right 03/21/2017   Hip pain 09/13/2016   Hot flash not due to menopause 07/27/2015   Well adult exam 02/05/2012   Heartburn 12/12/2010   Alopecia 12/12/2010   INSOMNIA, CHRONIC 08/26/2010   Rosacea 08/26/2010   SHOULDER PAIN 08/26/2010   VERRUCA VULGARIS, MULTIPLE 02/19/2009   GRAVES DISEASE 05/01/2008   Hypothyroidism 05/01/2008   ASYMPTOMATIC POSTMENOPAUSAL STATUS 02/28/2008   COUGH 12/09/2007   COMMON MIGRAINE 09/21/2007   Essential hypertension 05/23/2007   UPPER RESPIRATORY INFECTION (URI) 05/23/2007   ALLERGIC RHINITIS 05/23/2007   HIP PAIN 05/23/2007    Current Outpatient Medications on File Prior to Visit  Medication Sig Dispense Refill    acetaminophen (TYLENOL) 500 MG tablet Take 1,000 mg by mouth as needed for mild pain or headache.     amLODipine (NORVASC) 10 MG tablet Take 1 tablet (10 mg total) by mouth daily. 90 tablet 3   cephALEXin (KEFLEX) 500 MG capsule Take 1 capsule (500 mg total) by mouth 3 (three) times daily. 15 capsule 0   Cholecalciferol (VITAMIN D3) 1000 UNITS CAPS Take 1,000 Units by mouth daily.      hydrochlorothiazide (MICROZIDE) 12.5 MG capsule TAKE 1 CAPSULE BY MOUTH EVERY DAY 90 capsule 3   ketotifen (ALAWAY) 0.025 % ophthalmic solution Place 1 drop into both eyes as needed (itchy eyes).     naproxen (NAPROSYN) 500 MG tablet Take 1 tablet (500 mg total) by mouth 2 (two) times daily as needed (use pc). 60 tablet 2   potassium chloride SA (KLOR-CON M20) 20 MEQ tablet TAKE 1 TABLET BY MOUTH EVERY DAY 90 tablet 3   SYNTHROID 75 MCG tablet TAKE 1 TABLET DAILY BEFORE BREAKFAST (BRAND) 30 tablet 11   triamcinolone cream (KENALOG) 0.1 % Apply 1 application topically 2 (two) times daily. 80 g 3   vitamin E 400 UNIT capsule Take 400 Units by mouth daily.       No current facility-administered medications on file prior to visit.    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 or other cause, without mention of thyrotoxic crisis or storm    Unspecified essential hypertension    Warts     Past Surgical History:  Procedure Laterality Date   CESAREAN SECTION  [phone]   x2    Social History   Socioeconomic History   Marital status: Married    Spouse name: Not on file   Number of children: Not on file    Years of education: Not on file   Highest education level: Not on file  Occupational History   Occupation: Runner, broadcasting/film/video aid  Tobacco Use   Smoking status: Never Smoker   Smokeless tobacco: Never Used  Substance and Sexual Activity   Alcohol use: No   Drug use: No   Sexual activity: Yes  Other Topics Concern   Not on file  Social History Narrative   Does drink fair amount of caffeine   Social Determinants of Health   Financial Resource Strain:    Difficulty of Paying Living Expenses: Not on file  Food Insecurity:    Worried About Programme researcher, broadcasting/film/video in the Last Year: Not on file   [employer] of Food in the Last Year: Not on file  Transportation Needs:    Lack of Transportation (Medical): Not on file   Lack of Transportation (Non-Medical): Not on file  Physical Activity:    Days of Exercise per Week: Not on file   Minutes of Exercise per Session: Not on file  Stress:    Feeling of Stress : Not on file  Social Connections:    Frequency of Communication with Friends and Family: Not on file   Frequency of Social Gatherings with Friends and Family: Not on file   Attends Religious Services: Not on file   Active Member of Clubs or Organizations: Not on file   Attends Banker Meetings: Not on file   Marital Status: Not on file    Family History  Problem Relation Age of Onset   Asthma Mother    Asthma Sister    Hypertension Other    Colon cancer Neg Hx    Diabetes Neg Hx    Rectal cancer Neg Hx    Stomach cancer Neg Hx     Review of Systems     Objective:  There were no vitals filed for this visit. BP Readings from Last 3 Encounters:  03/31/19 (!) 154/96  08/26/18 (!) 170/110  07/18/18 132/88   Wt Readings from Last 3 Encounters:  03/31/19 181 lb (82.1 kg)  08/26/18 181 lb (82.1 kg)  07/18/18 179 lb (81.2 kg)   There is no height or weight on file to calculate BMI.   Physical Exam         Assessment & Plan:    See  Problem List for Assessment and Plan of chronic medical problems.    This visit occurred during the SARS-CoV-2 public health emergency.  Safety protocols were in place, including screening questions prior to the visit, additional usage of staff PPE, and extensive cleaning of exam room while observing appropriate contact time as indicated  for disinfecting solutions.

## 2020-03-10 ENCOUNTER — Encounter: Payer: Self-pay | Admitting: Internal Medicine

## 2020-03-10 ENCOUNTER — Ambulatory Visit (HOSPITAL_COMMUNITY)
Admission: RE | Admit: 2020-03-10 | Discharge: 2020-03-10 | Disposition: A | Discharge status: Home / Self Care | Payer: BC Managed Care – PPO | Source: Ambulatory Visit | Attending: Cardiovascular Disease | Admitting: Cardiovascular Disease

## 2020-03-10 ENCOUNTER — Other Ambulatory Visit: Payer: Self-pay

## 2020-03-10 ENCOUNTER — Ambulatory Visit (INDEPENDENT_AMBULATORY_CARE_PROVIDER_SITE_OTHER): Payer: BC Managed Care – PPO | Admitting: Internal Medicine

## 2020-03-10 VITALS — BP 144/92 | HR 94 | Temp 98.8°F | Wt 171.4 lb

## 2020-03-10 DIAGNOSIS — M7989 Other specified soft tissue disorders: Secondary | ICD-10-CM | POA: Insufficient documentation

## 2020-03-10 DIAGNOSIS — M79661 Pain in right lower leg: Secondary | ICD-10-CM

## 2020-03-10 MED ORDER — NAPROXEN 500 MG PO TABS: 500.0000 mg | ORAL_TABLET | Freq: Two times a day (BID) | ORAL | 2 refills | Status: DC | PRN

## 2020-03-10 NOTE — Patient Instructions (Addendum)
A leg ultrasound was ordered to rule out a blood clot.     Take the naprosyn twice daily with food as needed if the ultrasound is negative.  You should ice the knee and elevate it when you can.    Please call if there is no improvement in your symptoms.

## 2020-03-10 NOTE — Assessment & Plan Note (Signed)
Acute Started one week ago ? DVT vs flare of OA ( new onset)  Injured her leg 3 months prior, jumped rope a few days prior Will get an [place] today to r/o DVT Deferred xray today If [place] neg - naprosyn BID w/ food, ice and elevate when able Call if no improvement

## 2020-03-26 ENCOUNTER — Other Ambulatory Visit: Payer: Self-pay | Admitting: Internal Medicine

## 2020-04-09 ENCOUNTER — Ambulatory Visit (INDEPENDENT_AMBULATORY_CARE_PROVIDER_SITE_OTHER): Payer: BC Managed Care – PPO | Admitting: Internal Medicine

## 2020-04-09 ENCOUNTER — Other Ambulatory Visit: Payer: Self-pay

## 2020-04-09 ENCOUNTER — Encounter: Payer: Self-pay | Admitting: Internal Medicine

## 2020-04-09 VITALS — BP 144/98 | HR 77 | Temp 98.3°F | Ht 63.5 in | Wt 169.0 lb

## 2020-04-09 DIAGNOSIS — I10 Essential (primary) hypertension: Secondary | ICD-10-CM

## 2020-04-09 DIAGNOSIS — M65311 Trigger thumb, right thumb: Secondary | ICD-10-CM | POA: Diagnosis not present

## 2020-04-09 DIAGNOSIS — Z0000 Encounter for general adult medical examination without abnormal findings: Secondary | ICD-10-CM

## 2020-04-09 DIAGNOSIS — Z23 Encounter for immunization: Secondary | ICD-10-CM | POA: Diagnosis not present

## 2020-04-09 DIAGNOSIS — M25512 Pain in left shoulder: Secondary | ICD-10-CM | POA: Diagnosis not present

## 2020-04-09 MED ORDER — HYDROCHLOROTHIAZIDE 12.5 MG PO CAPS
ORAL_CAPSULE | ORAL | 3 refills | Status: DC
Start: 2020-04-09 — End: 2020-04-13

## 2020-04-09 MED ORDER — SYNTHROID 75 MCG PO TABS: ORAL_TABLET | ORAL | 3 refills | Status: DC

## 2020-04-09 MED ORDER — AMLODIPINE-OLMESARTAN 10-20 MG PO TABS: 1.0000 | ORAL_TABLET | Freq: Every day | ORAL | 3 refills | Status: DC

## 2020-04-09 NOTE — Assessment & Plan Note (Signed)
Worse Olmes- Aml

## 2020-04-09 NOTE — Patient Instructions (Signed)
Rice bag Voltaren gel Massage

## 2020-04-09 NOTE — Progress Notes (Signed)
Subjective:  Patient ID: Tina Rivera, female    DOB: [birth year]  Age: 59 y.o. MRN: [id number]  CC: Annual Exam   HPI Tina Rivera presents for a well exam C/o L shoulder pain C/o finger pain, R thumb would trigger C/o HTN  Outpatient Medications Prior to Visit  Medication Sig Dispense Refill  . acetaminophen (TYLENOL) 500 MG tablet Take 1,000 mg by mouth as needed for mild pain or headache.    Marland Kitchen. amLODipine (NORVASC) 10 MG tablet Take 1 tablet (10 mg total) by mouth daily. 90 tablet 3  . Cholecalciferol (VITAMIN D3) 1000 UNITS CAPS Take 1,000 Units by mouth daily.     . hydrochlorothiazide (MICROZIDE) 12.5 MG capsule TAKE 1 CAPSULE BY MOUTH EVERY DAY 90 capsule 3  . ketotifen (ALAWAY) 0.025 % ophthalmic solution Place 1 drop into both eyes as needed (itchy eyes).    . naproxen (NAPROSYN) 500 MG tablet Take 1 tablet (500 mg total) by mouth 2 (two) times daily as needed (use pc). 60 tablet 2  . potassium chloride SA (KLOR-CON M20) 20 MEQ tablet TAKE 1 TABLET BY MOUTH EVERY DAY 90 tablet 3  . SYNTHROID 75 MCG tablet TAKE 1 TABLET DAILY BEFORE BREAKFAST (BRAND) Annual appt w/labs is due pt must see provider for future refills 30 tablet 0  . triamcinolone cream (KENALOG) 0.1 % Apply 1 application topically 2 (two) times daily. 80 g 3  . vitamin E 400 UNIT capsule Take 400 Units by mouth daily.       No facility-administered medications prior to visit.    ROS: Review of Systems  Constitutional: Negative for activity change, appetite change, chills, fatigue and unexpected weight change.  HENT: Negative for congestion, mouth sores and sinus pressure.   Eyes: Negative for visual disturbance.  Respiratory: Negative for cough and chest tightness.   Gastrointestinal: Negative for abdominal pain and nausea.  Genitourinary: Negative for difficulty urinating, frequency and vaginal pain.  Musculoskeletal: Positive for arthralgias and back pain. Negative for gait problem.  Skin:  Negative for pallor and rash.  Neurological: Negative for dizziness, tremors, weakness, numbness and headaches.  Psychiatric/Behavioral: Negative for confusion and sleep disturbance.    Objective:  BP (!) 144/98 (BP Location: Left Arm, Patient Position: Sitting, Cuff Size: Large)   Pulse 77   Temp 98.3 F (36.8 C) (Oral)   Ht 5' 3.5" (1.613 m)   Wt 169 lb (76.7 kg)   LMP 02/05/2008   SpO2 99%   BMI 29.47 kg/m   BP Readings from Last 3 Encounters:  04/09/20 (!) 144/98  03/10/20 (!) 144/92  03/31/19 (!) 154/96    Wt Readings from Last 3 Encounters:  04/09/20 169 lb (76.7 kg)  03/10/20 171 lb 6.4 oz (77.7 kg)  03/31/19 181 lb (82.1 kg)    Physical Exam Constitutional:      General: She is not in acute distress.    Appearance: She is well-developed.  HENT:     Head: Normocephalic.     Right Ear: External ear normal.     Left Ear: External ear normal.     Nose: Nose normal.  Eyes:     General:        Right eye: No discharge.        Left eye: No discharge.     Conjunctiva/sclera: Conjunctivae normal.     Pupils: Pupils are equal, round, and reactive to light.  Neck:     Thyroid: No thyromegaly.     Vascular:  No JVD.     Trachea: No tracheal deviation.  Cardiovascular:     Rate and Rhythm: Normal rate and regular rhythm.     Heart sounds: Normal heart sounds.  Pulmonary:     Effort: No respiratory distress.     Breath sounds: No stridor. No wheezing.  Abdominal:     General: Bowel sounds are normal. There is no distension.     Palpations: Abdomen is soft. There is no mass.     Tenderness: There is no abdominal tenderness. There is no guarding or rebound.  Musculoskeletal:        General: Tenderness present.     Cervical back: Normal range of motion and neck supple.  Lymphadenopathy:     Cervical: No cervical adenopathy.  Skin:    Findings: No erythema or rash.  Neurological:     Mental Status: She is oriented to person, place, and time.     Cranial Nerves:  No cranial nerve deficit.     Motor: No abnormal muscle tone.     Coordination: Coordination normal.     Deep Tendon Reflexes: Reflexes normal.  Psychiatric:        Behavior: Behavior normal.        Thought Content: Thought content normal.        Judgment: Judgment normal.   The base of the right thumb is tender Left shoulder is sensitive with range of motion  Lab Results  Component Value Date   WBC 6.3 03/31/2019   HGB 13.0 03/31/2019   HCT 39.6 03/31/2019   PLT 280.0 03/31/2019   GLUCOSE 97 03/31/2019   CHOL 147 03/31/2019   TRIG 71.0 03/31/2019   HDL 81.90 03/31/2019   LDLCALC 51 03/31/2019   ALT 15 03/31/2019   AST 18 03/31/2019   NA 140 03/31/2019   K 3.9 03/31/2019   CL 103 03/31/2019   CREATININE 1.04 03/31/2019   BUN 10 03/31/2019   CO2 27 03/31/2019   TSH 2.58 03/31/2019    VAS [place] LOWER EXTREMITY VENOUS (DVT)  Result Date: 03/12/2020  Lower Venous DVTStudy Other Indications: Patient presents with right knee and lower leg tenderness and                    swelling. She injured this leg about 3 months ago but it has                    been okay after that. She jumped rope recently and said a few                    days later is started to be painful behind the knee and she                    noticed the swelling proximal to and below the knee. The knee                    pops with walking and when getting up and down which has been                    painful. Risk Factors: None identified. Performing Technologist: Olegario Shearer RVT  Examination Guidelines: A complete evaluation includes B-mode imaging, spectral Doppler, color Doppler, and power Doppler as needed of all accessible portions of each vessel. Bilateral testing is considered an integral part of a complete examination. Limited examinations for reoccurring indications may be performed as noted.  The reflux portion of the exam is performed with the patient in reverse Trendelenburg.   +---------+---------------+---------+-----------+----------+--------------+ RIGHT    CompressibilityPhasicitySpontaneityPropertiesThrombus Aging +---------+---------------+---------+-----------+----------+--------------+ CFV      Full           Yes      Yes                                 +---------+---------------+---------+-----------+----------+--------------+ SFJ      Full           Yes      Yes                                 +---------+---------------+---------+-----------+----------+--------------+ FV Prox  Full           Yes      Yes                                 +---------+---------------+---------+-----------+----------+--------------+ FV Mid   Full           Yes      Yes                                 +---------+---------------+---------+-----------+----------+--------------+ FV DistalFull           Yes      Yes                                 +---------+---------------+---------+-----------+----------+--------------+ PFV      Full                                                        +---------+---------------+---------+-----------+----------+--------------+ POP      Full           Yes      Yes                                 +---------+---------------+---------+-----------+----------+--------------+ PTV      Full           Yes      Yes                                 +---------+---------------+---------+-----------+----------+--------------+ PERO     Full           Yes      Yes                                 +---------+---------------+---------+-----------+----------+--------------+ Gastroc  Full                                                        +---------+---------------+---------+-----------+----------+--------------+ GSV      Full  Yes      Yes                                 +---------+---------------+---------+-----------+----------+--------------+   Right Technical Findings: A non-vascular,  cystic structure is noted in the right popliteal fossa area measuring 4.8 x 2.0 cm.  +----+---------------+---------+-----------+----------+--------------+ LEFTCompressibilityPhasicitySpontaneityPropertiesThrombus Aging +----+---------------+---------+-----------+----------+--------------+ CFV Full           Yes      Yes                                 +----+---------------+---------+-----------+----------+--------------+     Summary: RIGHT: - No evidence of deep vein thrombosis in the lower extremity. No indirect evidence of obstruction proximal to the inguinal ligament. - A cystic structure is found in the popliteal fossa.  LEFT: - No evidence of common femoral vein obstruction.  *See table(s) above for measurements and observations. Electronically signed by Dina Rich MD on 03/12/2020 at 12:14:42 PM.    Final     Assessment & Plan:    Sonda Primes, MD

## 2020-04-09 NOTE — Assessment & Plan Note (Signed)

## 2020-04-09 NOTE — Assessment & Plan Note (Signed)
L  deltoid Rice bag Voltaren gel Massage Inj was offered   Yoga

## 2020-04-09 NOTE — Assessment & Plan Note (Signed)
Rice bag Voltaren gel Massage Inj was offered

## 2020-04-10 LAB — URINALYSIS
Bilirubin Urine: NEGATIVE
Glucose, UA: NEGATIVE
Hgb urine dipstick: NEGATIVE
Ketones, ur: NEGATIVE
Leukocytes,Ua: NEGATIVE
Nitrite: NEGATIVE
Protein, ur: NEGATIVE
Specific Gravity, Urine: 1.005 (ref 1.001–1.03)
pH: <=5 (ref 5.0–8.0)

## 2020-04-10 LAB — LIPID PANEL
Cholesterol: 156 mg/dL (ref <200)
HDL: 89 mg/dL (ref >=50)
LDL Cholesterol (Calc): 54 mg/dL (calc)
Non-HDL Cholesterol (Calc): 67 mg/dL (calc) (ref <130)
Total CHOL/HDL Ratio: 1.8 (calc) (ref <5.0)
Triglycerides: 51 mg/dL (ref <150)

## 2020-04-10 LAB — COMPREHENSIVE METABOLIC PANEL
AG Ratio: 1.4 (calc) (ref 1.0–2.5)
ALT: 14 U/L (ref 6–29)
AST: 26 U/L (ref 10–35)
Albumin: 4.6 g/dL (ref 3.6–5.1)
Alkaline phosphatase (APISO): 93 U/L (ref 37–153)
BUN/Creatinine Ratio: 11 (calc) (ref 6–22)
BUN: 13 mg/dL (ref 7–25)
CO2: 23 mmol/L (ref 20–32)
Calcium: 10.2 mg/dL (ref 8.6–10.4)
Chloride: 103 mmol/L (ref 98–110)
Creat: 1.2 mg/dL — ABNORMAL HIGH (ref 0.50–1.05)
Globulin: 3.4 g/dL (calc) (ref 1.9–3.7)
Glucose, Bld: 89 mg/dL (ref 65–99)
Potassium: 3.7 mmol/L (ref 3.5–5.3)
Sodium: 140 mmol/L (ref 135–146)
Total Bilirubin: 0.4 mg/dL (ref 0.2–1.2)
Total Protein: 8 g/dL (ref 6.1–8.1)

## 2020-04-10 LAB — CBC
HCT: 37.8 % (ref 35.0–45.0)
Hemoglobin: 12.3 g/dL (ref 11.7–15.5)
MCH: 26.7 pg — ABNORMAL LOW (ref 27.0–33.0)
MCHC: 32.5 g/dL (ref 32.0–36.0)
MCV: 82.2 fL (ref 80.0–100.0)
MPV: 12.1 fL (ref 7.5–12.5)
Platelets: 269 10*3/uL (ref 140–400)
RBC: 4.6 10*6/uL (ref 3.80–5.10)
RDW: 14.3 % (ref 11.0–15.0)
WBC: 6.1 10*3/uL (ref 3.8–10.8)

## 2020-04-10 LAB — TSH: TSH: 0.53 mIU/L (ref 0.40–4.50)

## 2020-04-12 ENCOUNTER — Telehealth: Payer: Self-pay | Admitting: Internal Medicine

## 2020-04-12 NOTE — Telephone Encounter (Signed)
amlodipine-olmesartan (AZOR) 10-20 MG tablet- New medication but stating it has not been sent in for her   hydrochlorothiazide (MICROZIDE) 12.5 MG capsule-Patient already has this medication and he sent this in [id number] - [place], [place] - [address] Phone:  [phone]  Fax:  [phone]     Okay to LVM per patient

## 2020-04-13 MED ORDER — AMLODIPINE-OLMESARTAN 10-20 MG PO TABS
1.0000 | ORAL_TABLET | Freq: Every day | ORAL | 3 refills | Status: DC
Start: 2020-04-13 — End: 2020-07-01

## 2020-04-13 MED ORDER — HYDROCHLOROTHIAZIDE 12.5 MG PO CAPS
ORAL_CAPSULE | ORAL | 3 refills | Status: DC
Start: 2020-04-13 — End: 2020-07-01

## 2020-04-13 NOTE — Telephone Encounter (Signed)
Both meds prescriptions were emailed on 10/22.  Thanks

## 2020-04-13 NOTE — Addendum Note (Signed)
Addended by: Deatra James on: 04/13/2020 02:49 PM   Modules accepted: Orders

## 2020-04-13 NOTE — Telephone Encounter (Signed)
Left msg on vm stating we resent both med to [facility].../lmb

## 2020-06-26 ENCOUNTER — Other Ambulatory Visit: Payer: Self-pay | Admitting: Internal Medicine

## 2020-07-01 ENCOUNTER — Encounter: Payer: Self-pay | Admitting: Internal Medicine

## 2020-07-01 ENCOUNTER — Ambulatory Visit (INDEPENDENT_AMBULATORY_CARE_PROVIDER_SITE_OTHER): Payer: BC Managed Care – PPO | Admitting: Internal Medicine

## 2020-07-01 ENCOUNTER — Other Ambulatory Visit: Payer: Self-pay

## 2020-07-01 DIAGNOSIS — I10 Essential (primary) hypertension: Secondary | ICD-10-CM

## 2020-07-01 MED ORDER — OLMESARTAN MEDOXOMIL 20 MG PO TABS: 20.0000 mg | ORAL_TABLET | Freq: Every day | ORAL | 3 refills | Status: DC

## 2020-07-01 MED ORDER — HYDROCHLOROTHIAZIDE 12.5 MG PO CAPS: ORAL_CAPSULE | ORAL | 3 refills | Status: DC

## 2020-07-01 MED ORDER — AMLODIPINE BESYLATE 10 MG PO TABS
10.0000 mg | ORAL_TABLET | Freq: Every day | ORAL | 3 refills | Status: DC
Start: 2020-07-01 — End: 2021-04-14

## 2020-07-01 NOTE — Patient Instructions (Addendum)
You need to be taking:  Olmesartan 20 mg Amlodipine 10 mg HCTZ 12.5 mg ----------------------------------------------------------------------------------  For a mild COVID-19 case you can take zinc 50 mg a day for 1 week, vitamin C 1000 mg daily for 1 week, vitamin D2 50,000 units weekly for 2 months, Quercetin 500 mg twice a day for 1 week.  Maintain good oral hydration and take Tylenol with high fever.  Thanks  You can buy COVID-19 express tests for home use. They are inexpensive, roughly $15 for 2 tests.  There is a link below:  [email or web address]

## 2020-07-01 NOTE — Progress Notes (Signed)
-year-old fell for infected ureteral hardware with   Subjective:  Patient ID: Tina Rivera, female    DOB: 05/29/1961  Age: 60 y.o. MRN: [id number]  CC: Follow-up   HPI VERSA CRATON presents for HTN. Taking Norvasc and HCT only  Outpatient Medications Prior to Visit  Medication Sig Dispense Refill  . acetaminophen (TYLENOL) 500 MG tablet Take 1,000 mg by mouth as needed for mild pain or headache.    Marland Kitchen amLODipine (NORVASC) 10 MG tablet TAKE 1 TABLET BY MOUTH EVERY DAY 90 tablet 2  . amlodipine-olmesartan (AZOR) 10-20 MG tablet Take 1 tablet by mouth daily. 90 tablet 3  . Cholecalciferol (VITAMIN D3) 1000 UNITS CAPS Take 1,000 Units by mouth daily.    . hydrochlorothiazide (MICROZIDE) 12.5 MG capsule TAKE 1 CAPSULE BY MOUTH EVERY DAY 90 capsule 3  . ketotifen (ZADITOR) 0.025 % ophthalmic solution Place 1 drop into both eyes as needed (itchy eyes).    . naproxen (NAPROSYN) 500 MG tablet Take 1 tablet (500 mg total) by mouth 2 (two) times daily as needed (use pc). 60 tablet 2  . potassium chloride SA (KLOR-CON M20) 20 MEQ tablet TAKE 1 TABLET BY MOUTH EVERY DAY 90 tablet 2  . SYNTHROID 75 MCG tablet TAKE 1 TABLET DAILY BEFORE BREAKFAST (BRAND 90 tablet 3  . triamcinolone cream (KENALOG) 0.1 % Apply 1 application topically 2 (two) times daily. 80 g 3  . vitamin E 400 UNIT capsule Take 400 Units by mouth daily.     No facility-administered medications prior to visit.    ROS: Review of Systems  Constitutional: Negative for activity change, appetite change, chills, fatigue and unexpected weight change.  HENT: Negative for congestion, mouth sores and sinus pressure.   Eyes: Negative for visual disturbance.  Respiratory: Negative for cough and chest tightness.   Gastrointestinal: Negative for abdominal pain and nausea.  Genitourinary: Negative for difficulty urinating, frequency and vaginal pain.  Musculoskeletal: Negative for back pain and gait problem.  Skin: Negative for pallor  and rash.  Neurological: Negative for dizziness, tremors, weakness, numbness and headaches.  Psychiatric/Behavioral: Negative for confusion and sleep disturbance.    Objective:  BP (!) 142/88 (BP Location: Left Arm)   Pulse (!) 122   Temp (!) 101.8 F (38.8 C) (Oral)   Wt 170 lb 9.6 oz (77.4 kg)   LMP 02/05/2008   SpO2 98%   BMI 29.75 kg/m   BP Readings from Last 3 Encounters:  07/01/20 (!) 142/88  04/09/20 (!) 144/98  03/10/20 (!) 144/92    Wt Readings from Last 3 Encounters:  07/01/20 170 lb 9.6 oz (77.4 kg)  04/09/20 169 lb (76.7 kg)  03/10/20 171 lb 6.4 oz (77.7 kg)    Physical Exam Constitutional:      General: She is not in acute distress.    Appearance: She is well-developed.  HENT:     Head: Normocephalic.     Right Ear: External ear normal.     Left Ear: External ear normal.     Nose: Nose normal.     Mouth/Throat:     Mouth: Oropharynx is clear and moist.  Eyes:     General:        Right eye: No discharge.        Left eye: No discharge.     Conjunctiva/sclera: Conjunctivae normal.     Pupils: Pupils are equal, round, and reactive to light.  Neck:     Thyroid: No thyromegaly.  Vascular: No JVD.     Trachea: No tracheal deviation.  Cardiovascular:     Rate and Rhythm: Normal rate and regular rhythm.     Heart sounds: Normal heart sounds.  Pulmonary:     Effort: No respiratory distress.     Breath sounds: No stridor. No wheezing.  Abdominal:     General: Bowel sounds are normal. There is no distension.     Palpations: Abdomen is soft. There is no mass.     Tenderness: There is no abdominal tenderness. There is no guarding or rebound.  Musculoskeletal:        General: No tenderness or edema.     Cervical back: Normal range of motion and neck supple.  Lymphadenopathy:     Cervical: No cervical adenopathy.  Skin:    Findings: No erythema or rash.  Neurological:     Cranial Nerves: No cranial nerve deficit.     Motor: No abnormal muscle tone.      Coordination: Coordination normal.     Deep Tendon Reflexes: Reflexes normal.  Psychiatric:        Mood and Affect: Mood and affect normal.        Behavior: Behavior normal.        Thought Content: Thought content normal.        Judgment: Judgment normal.     Lab Results  Component Value Date   WBC 6.1 04/09/2020   HGB 12.3 04/09/2020   HCT 37.8 04/09/2020   PLT 269 04/09/2020   GLUCOSE 89 04/09/2020   CHOL 156 04/09/2020   TRIG 51 04/09/2020   HDL 89 04/09/2020   LDLCALC 54 04/09/2020   ALT 14 04/09/2020   AST 26 04/09/2020   NA 140 04/09/2020   K 3.7 04/09/2020   CL 103 04/09/2020   CREATININE 1.20 (H) 04/09/2020   BUN 13 04/09/2020   CO2 23 04/09/2020   TSH 0.53 04/09/2020    VAS [place] LOWER EXTREMITY VENOUS (DVT)  Result Date: 03/12/2020  Lower Venous DVTStudy Other Indications: Patient presents with right knee and lower leg tenderness and                    swelling. She injured this leg about 3 months ago but it has                    been okay after that. She jumped rope recently and said a few                    days later is started to be painful behind the knee and she                    noticed the swelling proximal to and below the knee. The knee                    pops with walking and when getting up and down which has been                    painful. Risk Factors: None identified. Performing Technologist: Olegario Shearer RVT  Examination Guidelines: A complete evaluation includes B-mode imaging, spectral Doppler, color Doppler, and power Doppler as needed of all accessible portions of each vessel. Bilateral testing is considered an integral part of a complete examination. Limited examinations for reoccurring indications may be performed as noted. The reflux portion of the exam is performed with the patient  in reverse Trendelenburg.  +---------+---------------+---------+-----------+----------+--------------+ RIGHT     CompressibilityPhasicitySpontaneityPropertiesThrombus Aging +---------+---------------+---------+-----------+----------+--------------+ CFV      Full           Yes      Yes                                 +---------+---------------+---------+-----------+----------+--------------+ SFJ      Full           Yes      Yes                                 +---------+---------------+---------+-----------+----------+--------------+ FV Prox  Full           Yes      Yes                                 +---------+---------------+---------+-----------+----------+--------------+ FV Mid   Full           Yes      Yes                                 +---------+---------------+---------+-----------+----------+--------------+ FV DistalFull           Yes      Yes                                 +---------+---------------+---------+-----------+----------+--------------+ PFV      Full                                                        +---------+---------------+---------+-----------+----------+--------------+ POP      Full           Yes      Yes                                 +---------+---------------+---------+-----------+----------+--------------+ PTV      Full           Yes      Yes                                 +---------+---------------+---------+-----------+----------+--------------+ PERO     Full           Yes      Yes                                 +---------+---------------+---------+-----------+----------+--------------+ Gastroc  Full                                                        +---------+---------------+---------+-----------+----------+--------------+ GSV      Full           Yes  Yes                                 +---------+---------------+---------+-----------+----------+--------------+   Right Technical Findings: A non-vascular, cystic structure is noted in the right popliteal fossa area measuring 4.8 x 2.0 cm.   +----+---------------+---------+-----------+----------+--------------+ LEFTCompressibilityPhasicitySpontaneityPropertiesThrombus Aging +----+---------------+---------+-----------+----------+--------------+ CFV Full           Yes      Yes                                 +----+---------------+---------+-----------+----------+--------------+     Summary: RIGHT: - No evidence of deep vein thrombosis in the lower extremity. No indirect evidence of obstruction proximal to the inguinal ligament. - A cystic structure is found in the popliteal fossa.  LEFT: - No evidence of common femoral vein obstruction.  *See table(s) above for measurements and observations. Electronically signed by Dina Rich MD on 03/12/2020 at 12:14:42 PM.    Final     Assessment & Plan:

## 2020-07-01 NOTE — Assessment & Plan Note (Addendum)
Pt needs to be on: Olmesartan 20 mg Amlodipine 10 mg HCTZ 12.5 mg

## 2020-07-06 ENCOUNTER — Telehealth: Payer: Self-pay | Admitting: Internal Medicine

## 2020-07-06 NOTE — Telephone Encounter (Signed)
Patient called and said she picked up  amLODipine (NORVASC) 10 MG tablet  From her pharmacy she said she usually picks up amlodipine-olmesartan (AZOR) 10-20 MG tablet  She was wondering ifamlodipine-olmesartan (AZOR) 10-20 MG tablet  Could be sent to  [id number] - [place], [place] - [address]

## 2020-07-07 NOTE — Telephone Encounter (Signed)
Olmesartan was sent separately.  There was a problem in the past either with insurance coverage or manufacturing so the prescription had to be split.  Thanks

## 2020-07-07 NOTE — Telephone Encounter (Signed)
Notified pt w/MD response.../lmb 

## 2020-11-10 ENCOUNTER — Ambulatory Visit: Payer: BC Managed Care – PPO | Admitting: Internal Medicine

## 2020-11-18 ENCOUNTER — Ambulatory Visit: Payer: BC Managed Care – PPO | Admitting: Internal Medicine

## 2020-11-18 ENCOUNTER — Other Ambulatory Visit: Payer: Self-pay

## 2020-11-18 ENCOUNTER — Encounter: Payer: Self-pay | Admitting: Internal Medicine

## 2020-11-18 VITALS — BP 146/88 | HR 74 | Temp 98.1°F | Ht 63.0 in | Wt 163.0 lb

## 2020-11-18 DIAGNOSIS — I10 Essential (primary) hypertension: Secondary | ICD-10-CM | POA: Diagnosis not present

## 2020-11-18 DIAGNOSIS — J301 Allergic rhinitis due to pollen: Secondary | ICD-10-CM

## 2020-11-18 DIAGNOSIS — M79602 Pain in left arm: Secondary | ICD-10-CM | POA: Diagnosis not present

## 2020-11-18 DIAGNOSIS — E034 Atrophy of thyroid (acquired): Secondary | ICD-10-CM

## 2020-11-18 LAB — URINALYSIS
Bilirubin Urine: NEGATIVE
Hgb urine dipstick: NEGATIVE
Ketones, ur: NEGATIVE
Leukocytes,Ua: NEGATIVE
Nitrite: NEGATIVE
Specific Gravity, Urine: <=1.005 — AB (ref 1.000–1.030)
Total Protein, Urine: NEGATIVE
Urine Glucose: NEGATIVE
Urobilinogen, UA: 0.2 (ref 0.0–1.0)
pH: 6 (ref 5.0–8.0)

## 2020-11-18 LAB — CBC WITH DIFFERENTIAL/PLATELET
Basophils Absolute: 0.1 10*3/uL (ref 0.0–0.1)
Basophils Relative: 0.8 % (ref 0.0–3.0)
Eosinophils Absolute: 0.1 10*3/uL (ref 0.0–0.7)
Eosinophils Relative: 1.5 % (ref 0.0–5.0)
HCT: 35.1 % — ABNORMAL LOW (ref 36.0–46.0)
Hemoglobin: 11.6 g/dL — ABNORMAL LOW (ref 12.0–15.0)
Lymphocytes Relative: 40.9 % (ref 12.0–46.0)
Lymphs Abs: 2.6 10*3/uL (ref 0.7–4.0)
MCHC: 33.1 g/dL (ref 30.0–36.0)
MCV: 81 fl (ref 78.0–100.0)
Monocytes Absolute: 0.6 10*3/uL (ref 0.1–1.0)
Monocytes Relative: 10 % (ref 3.0–12.0)
Neutro Abs: 3 10*3/uL (ref 1.4–7.7)
Neutrophils Relative %: 46.8 % (ref 43.0–77.0)
Platelets: 270 10*3/uL (ref 150.0–400.0)
RBC: 4.33 Mil/uL (ref 3.87–5.11)
RDW: 15.1 % (ref 11.5–15.5)
WBC: 6.4 10*3/uL (ref 4.0–10.5)

## 2020-11-18 LAB — TSH: TSH: 1.35 u[IU]/mL (ref 0.35–4.50)

## 2020-11-18 MED ORDER — FLUTICASONE PROPIONATE 50 MCG/ACT NA SUSP: 2.0000 | Freq: Every day | NASAL | 6 refills | Status: AC

## 2020-11-18 MED ORDER — OLMESARTAN-AMLODIPINE-HCTZ 40-10-12.5 MG PO TABS: 1.0000 | ORAL_TABLET | Freq: Every day | ORAL | 11 refills | Status: DC

## 2020-11-18 MED ORDER — CETIRIZINE HCL 10 MG PO TABS: 10.0000 mg | ORAL_TABLET | Freq: Every day | ORAL | 11 refills | Status: DC

## 2020-11-18 NOTE — Assessment & Plan Note (Signed)
Worse Flonase, Zyrtec

## 2020-11-18 NOTE — Assessment & Plan Note (Signed)
Start Tribenzor

## 2020-11-18 NOTE — Progress Notes (Signed)
Subjective:  Patient ID: Tina Rivera, female    DOB: 11/10/1960  Age: 60 y.o. MRN: [id number]  CC: Hypertension (3 month  check. Refill on amlodipine and discuss other BP medication.)   HPI Tina Rivera presents for L nostril running F/u HTN C/o L arm pain x 2 months  Outpatient Medications Prior to Visit  Medication Sig Dispense Refill  . acetaminophen (TYLENOL) 500 MG tablet Take 1,000 mg by mouth as needed for mild pain or headache.    Marland Kitchen amLODipine (NORVASC) 10 MG tablet Take 1 tablet (10 mg total) by mouth daily. 90 tablet 3  . Cholecalciferol (VITAMIN D3) 1000 UNITS CAPS Take 1,000 Units by mouth daily.    . hydrochlorothiazide (MICROZIDE) 12.5 MG capsule TAKE 1 CAPSULE BY MOUTH EVERY DAY 90 capsule 3  . ketotifen (ZADITOR) 0.025 % ophthalmic solution Place 1 drop into both eyes as needed (itchy eyes).    . naproxen (NAPROSYN) 500 MG tablet Take 1 tablet (500 mg total) by mouth 2 (two) times daily as needed (use pc). 60 tablet 2  . olmesartan (BENICAR) 20 MG tablet Take 1 tablet (20 mg total) by mouth daily. 90 tablet 3  . potassium chloride SA (KLOR-CON M20) 20 MEQ tablet TAKE 1 TABLET BY MOUTH EVERY DAY 90 tablet 2  . SYNTHROID 75 MCG tablet TAKE 1 TABLET DAILY BEFORE BREAKFAST (BRAND 90 tablet 3  . triamcinolone cream (KENALOG) 0.1 % Apply 1 application topically 2 (two) times daily. 80 g 3  . vitamin E 400 UNIT capsule Take 400 Units by mouth daily.     No facility-administered medications prior to visit.    ROS: Review of Systems  Constitutional: Negative for activity change, appetite change, chills, fatigue and unexpected weight change.  HENT: Negative for congestion, mouth sores and sinus pressure.   Eyes: Negative for visual disturbance.  Respiratory: Negative for cough and chest tightness.   Gastrointestinal: Negative for abdominal pain and nausea.  Genitourinary: Negative for difficulty urinating, frequency and vaginal pain.  Musculoskeletal: Negative for  back pain and gait problem.  Skin: Negative for pallor and rash.  Neurological: Negative for dizziness, tremors, weakness, numbness and headaches.  Psychiatric/Behavioral: Negative for confusion and sleep disturbance. The patient is not nervous/anxious.     Objective:  BP (!) 146/88   Pulse 74   Temp 98.1 F (36.7 C) (Oral)   Ht 5\' 3"  (1.6 m)   Wt 163 lb (73.9 kg)   LMP 02/05/2008   SpO2 99%   BMI 28.87 kg/m   BP Readings from Last 3 Encounters:  11/18/20 (!) 146/88  07/01/20 (!) 142/88  04/09/20 (!) 144/98    Wt Readings from Last 3 Encounters:  11/18/20 163 lb (73.9 kg)  07/01/20 170 lb 9.6 oz (77.4 kg)  04/09/20 169 lb (76.7 kg)    Physical Exam Constitutional:      General: She is not in acute distress.    Appearance: She is well-developed.  HENT:     Head: Normocephalic.     Right Ear: External ear normal.     Left Ear: External ear normal.     Nose: Nose normal.  Eyes:     General:        Right eye: No discharge.        Left eye: No discharge.     Conjunctiva/sclera: Conjunctivae normal.     Pupils: Pupils are equal, round, and reactive to light.  Neck:     Thyroid: No thyromegaly.  Vascular: No JVD.     Trachea: No tracheal deviation.  Cardiovascular:     Rate and Rhythm: Normal rate and regular rhythm.     Heart sounds: Normal heart sounds.  Pulmonary:     Effort: No respiratory distress.     Breath sounds: No stridor. No wheezing.  Abdominal:     General: Bowel sounds are normal. There is no distension.     Palpations: Abdomen is soft. There is no mass.     Tenderness: There is no abdominal tenderness. There is no guarding or rebound.  Musculoskeletal:        General: No tenderness.     Cervical back: Normal range of motion and neck supple.  Lymphadenopathy:     Cervical: No cervical adenopathy.  Skin:    Findings: No erythema or rash.  Neurological:     Cranial Nerves: No cranial nerve deficit.     Motor: No abnormal muscle tone.      Coordination: Coordination normal.     Deep Tendon Reflexes: Reflexes normal.  Psychiatric:        Behavior: Behavior normal.        Thought Content: Thought content normal.        Judgment: Judgment normal.     Lab Results  Component Value Date   WBC 6.1 04/09/2020   HGB 12.3 04/09/2020   HCT 37.8 04/09/2020   PLT 269 04/09/2020   GLUCOSE 89 04/09/2020   CHOL 156 04/09/2020   TRIG 51 04/09/2020   HDL 89 04/09/2020   LDLCALC 54 04/09/2020   ALT 14 04/09/2020   AST 26 04/09/2020   NA 140 04/09/2020   K 3.7 04/09/2020   CL 103 04/09/2020   CREATININE 1.20 (H) 04/09/2020   BUN 13 04/09/2020   CO2 23 04/09/2020   TSH 0.53 04/09/2020    VAS [place] LOWER EXTREMITY VENOUS (DVT)  Result Date: 03/12/2020  Lower Venous DVTStudy Other Indications: Patient presents with right knee and lower leg tenderness and                    swelling. She injured this leg about 3 months ago but it has                    been okay after that. She jumped rope recently and said a few                    days later is started to be painful behind the knee and she                    noticed the swelling proximal to and below the knee. The knee                    pops with walking and when getting up and down which has been                    painful. Risk Factors: None identified. Performing Technologist: Olegario Shearer RVT  Examination Guidelines: A complete evaluation includes B-mode imaging, spectral Doppler, color Doppler, and power Doppler as needed of all accessible portions of each vessel. Bilateral testing is considered an integral part of a complete examination. Limited examinations for reoccurring indications may be performed as noted. The reflux portion of the exam is performed with the patient in reverse Trendelenburg.  +---------+---------------+---------+-----------+----------+--------------+ RIGHT    CompressibilityPhasicitySpontaneityPropertiesThrombus Aging  +---------+---------------+---------+-----------+----------+--------------+ CFV  Full           Yes      Yes                                 +---------+---------------+---------+-----------+----------+--------------+ SFJ      Full           Yes      Yes                                 +---------+---------------+---------+-----------+----------+--------------+ FV Prox  Full           Yes      Yes                                 +---------+---------------+---------+-----------+----------+--------------+ FV Mid   Full           Yes      Yes                                 +---------+---------------+---------+-----------+----------+--------------+ FV DistalFull           Yes      Yes                                 +---------+---------------+---------+-----------+----------+--------------+ PFV      Full                                                        +---------+---------------+---------+-----------+----------+--------------+ POP      Full           Yes      Yes                                 +---------+---------------+---------+-----------+----------+--------------+ PTV      Full           Yes      Yes                                 +---------+---------------+---------+-----------+----------+--------------+ PERO     Full           Yes      Yes                                 +---------+---------------+---------+-----------+----------+--------------+ Gastroc  Full                                                        +---------+---------------+---------+-----------+----------+--------------+ GSV      Full           Yes      Yes                                 +---------+---------------+---------+-----------+----------+--------------+  Right Technical Findings: A non-vascular, cystic structure is noted in the right popliteal fossa area measuring 4.8 x 2.0 cm.  +----+---------------+---------+-----------+----------+--------------+  LEFTCompressibilityPhasicitySpontaneityPropertiesThrombus Aging +----+---------------+---------+-----------+----------+--------------+ CFV Full           Yes      Yes                                 +----+---------------+---------+-----------+----------+--------------+     Summary: RIGHT: - No evidence of deep vein thrombosis in the lower extremity. No indirect evidence of obstruction proximal to the inguinal ligament. - A cystic structure is found in the popliteal fossa.  LEFT: - No evidence of common femoral vein obstruction.  *See table(s) above for measurements and observations. Electronically signed by Dina Rich MD on 03/12/2020 at 12:14:42 PM.    Final     Assessment & Plan:   There are no diagnoses linked to this encounter.   No orders of the defined types were placed in this encounter.    Follow-up: No follow-ups on file.  Sonda Primes, MD

## 2020-11-18 NOTE — Patient Instructions (Signed)
Carpal Tunnel Syndrome  Carpal tunnel syndrome is a condition that causes pain, weakness, and numbness in your hand and arm. Numbness is when you cannot feel an area in your body. The carpal tunnel is a narrow area that is on the palm side of your wrist. Repeated wrist motion or certain diseases may cause swelling in the tunnel. This swelling can pinch the main nerve in the wrist. This nerve is called the median nerve. What are the causes? This condition may be caused by:  Moving your hand and wrist over and over again while doing a task.  Injury to the wrist.  Arthritis.  A sac of fluid (cyst) or abnormal growth (tumor) in the carpal tunnel.  Fluid buildup during pregnancy.  Use of tools that vibrate. Sometimes the cause is not known. What increases the risk? The following factors may make you more likely to have this condition:  Having a job that makes you do these things: ? Move your hand over and over again. ? Work with tools that vibrate, such as drills or sanders.  Being a woman.  Having diabetes, obesity, thyroid problems, or kidney failure. What are the signs or symptoms? Symptoms of this condition include:  A tingling feeling in your fingers.  Tingling or loss of feeling in your hand.  Pain in your entire arm. This pain may get worse when you bend your wrist and elbow for a long time.  Pain in your wrist that goes up your arm to your shoulder.  Pain that goes down into your palm or fingers.  Weakness in your hands. You may find it hard to grab and hold items. You may feel worse at night. How is this treated? This condition may be treated with:  Lifestyle changes. You will be asked to stop or change the activity that caused your problem.  Doing exercises and activities that make bones, muscles, and tendons stronger (physical therapy).  Learning how to use your hand again (occupational therapy).  Medicines for pain and swelling. You may have injections in  your wrist.  A wrist splint or brace.  Surgery. Follow these instructions at home: If you have a splint or brace:  Wear the splint or brace as told by your doctor. Take it off only as told by your doctor.  Loosen the splint if your fingers: ? Tingle. ? Become numb. ? Turn cold and blue.  Keep the splint or brace clean.  If the splint or brace is not waterproof: ? Do not let it get wet. ? Cover it with a watertight covering when you take a bath or a shower. Managing pain, stiffness, and swelling If told, put ice on the painful area:  If you have a removable splint or brace, remove it as told by your doctor.  Put ice in a plastic bag.  Place a towel between your skin and the bag.  Leave the ice on for 20 minutes, 2-3 times per day. Do not fall asleep with the cold pack on your skin.  Take off the ice if your skin turns bright red. This is very important. If you cannot feel pain, heat, or cold, you have a greater risk of damage to the area. Move your fingers often to reduce stiffness and swelling.   General instructions  Take over-the-counter and prescription medicines only as told by your doctor.  Rest your wrist from any activity that may cause pain. If needed, talk with your boss at work about changes that can   help your wrist heal.  Do exercises as told by your doctor, physical therapist, or occupational therapist.  Keep all follow-up visits. Contact a doctor if:  You have new symptoms.  Medicine does not help your pain.  Your symptoms get worse. Get help right away if:  You have very bad numbness or tingling in your wrist or hand. Summary  Carpal tunnel syndrome is a condition that causes pain in your hand and arm.  It is often caused by repeated wrist motions.  Lifestyle changes and medicines are used to treat this problem. Surgery may help in very bad cases.  Follow your doctor's instructions about wearing a splint, resting your wrist, keeping follow-up  visits, and calling for help. This information is not intended to replace advice given to you by your health care provider. Make sure you discuss any questions you have with your health care provider. Document Revised: 10/16/2019 Document Reviewed: 10/16/2019 Elsevier Patient Education  2021 [employer]  

## 2020-11-19 LAB — COMPREHENSIVE METABOLIC PANEL
ALT: 14 U/L (ref 0–35)
AST: 19 U/L (ref 0–37)
Albumin: 4.5 g/dL (ref 3.5–5.2)
Alkaline Phosphatase: 92 U/L (ref 39–117)
BUN: 13 mg/dL (ref 6–23)
CO2: 25 mEq/L (ref 19–32)
Calcium: 10 mg/dL (ref 8.4–10.5)
Chloride: 101 mEq/L (ref 96–112)
Creatinine, Ser: 1.16 mg/dL (ref 0.40–1.20)
GFR: 51.8 mL/min — ABNORMAL LOW (ref >60.00)
Glucose, Bld: 81 mg/dL (ref 70–99)
Potassium: 4 mEq/L (ref 3.5–5.1)
Sodium: 139 mEq/L (ref 135–145)
Total Bilirubin: 0.5 mg/dL (ref 0.2–1.2)
Total Protein: 7.6 g/dL (ref 6.0–8.3)

## 2020-11-19 LAB — LIPID PANEL
Cholesterol: 151 mg/dL (ref 0–200)
HDL: 80.7 mg/dL (ref >39.00)
LDL Cholesterol: 60 mg/dL (ref 0–99)
NonHDL: 70.49
Total CHOL/HDL Ratio: 2
Triglycerides: 53 mg/dL (ref 0.0–149.0)
VLDL: 10.6 mg/dL (ref 0.0–40.0)

## 2020-11-22 NOTE — Assessment & Plan Note (Signed)
Musculoskeletal pain.  Stretching, range of motion exercises discussed

## 2021-03-13 ENCOUNTER — Other Ambulatory Visit: Payer: Self-pay | Admitting: Internal Medicine

## 2021-04-11 ENCOUNTER — Telehealth: Payer: Self-pay | Admitting: Internal Medicine

## 2021-04-14 ENCOUNTER — Encounter: Payer: Self-pay | Admitting: Internal Medicine

## 2021-04-14 ENCOUNTER — Other Ambulatory Visit: Payer: Self-pay

## 2021-04-14 ENCOUNTER — Ambulatory Visit (INDEPENDENT_AMBULATORY_CARE_PROVIDER_SITE_OTHER): Payer: BC Managed Care – PPO | Admitting: Internal Medicine

## 2021-04-14 VITALS — BP 132/88 | HR 73 | Temp 98.3°F | Ht 63.0 in | Wt 155.6 lb

## 2021-04-14 DIAGNOSIS — Z23 Encounter for immunization: Secondary | ICD-10-CM | POA: Diagnosis not present

## 2021-04-14 DIAGNOSIS — N2889 Other specified disorders of kidney and ureter: Secondary | ICD-10-CM

## 2021-04-14 DIAGNOSIS — N182 Chronic kidney disease, stage 2 (mild): Secondary | ICD-10-CM | POA: Diagnosis not present

## 2021-04-14 DIAGNOSIS — Z0000 Encounter for general adult medical examination without abnormal findings: Secondary | ICD-10-CM | POA: Diagnosis not present

## 2021-04-14 DIAGNOSIS — I10 Essential (primary) hypertension: Secondary | ICD-10-CM | POA: Diagnosis not present

## 2021-04-14 DIAGNOSIS — N183 Chronic kidney disease, stage 3 unspecified: Secondary | ICD-10-CM | POA: Insufficient documentation

## 2021-04-14 NOTE — Patient Instructions (Signed)
Cologuard vs colonoscopy 

## 2021-04-14 NOTE — Assessment & Plan Note (Signed)
Olmesartan 20 mg Amlodipine 10 mg HCTZ 12.5 mg  Start Tribenzor

## 2021-04-14 NOTE — Assessment & Plan Note (Signed)
Normal renal ultrasound - 2019 B is on HD Repeat labs today Hydrate well

## 2021-04-14 NOTE — Assessment & Plan Note (Signed)
We discussed age appropriate health related issues, including available/recomended screening tests and vaccinations. We discussed a need for adhering to healthy diet and exercise. Labs/EKG were reviewed/ordered. All questions were answered. Colon 2013 - she had a problem with IV sedation "burning" all over.  Cologuard vs colonoscopy discussed

## 2021-04-14 NOTE — Progress Notes (Signed)
Subjective:  Patient ID: Tina Rivera, female    DOB: Oct 23, 1960  Age: 60 y.o. MRN: [id number]  CC: Annual Exam   HPI LATIVIA VELIE presents for a well exam  Outpatient Medications Prior to Visit  Medication Sig Dispense Refill   acetaminophen (TYLENOL) 500 MG tablet Take 1,000 mg by mouth as needed for mild pain or headache.     cetirizine (ZYRTEC ALLERGY) 10 MG tablet Take 1 tablet (10 mg total) by mouth daily. 30 tablet 11   Cholecalciferol (VITAMIN D3) 1000 UNITS CAPS Take 1,000 Units by mouth daily.     fluticasone (FLONASE) 50 MCG/ACT nasal spray Place 2 sprays into both nostrils daily. 16 g 6   hydrochlorothiazide (MICROZIDE) 12.5 MG capsule TAKE 1 CAPSULE BY MOUTH EVERY DAY 90 capsule 3   ketotifen (ZADITOR) 0.025 % ophthalmic solution Place 1 drop into both eyes as needed (itchy eyes).     naproxen (NAPROSYN) 500 MG tablet Take 1 tablet (500 mg total) by mouth 2 (two) times daily as needed (use pc). 60 tablet 2   olmesartan (BENICAR) 20 MG tablet Take 1 tablet (20 mg total) by mouth daily. 90 tablet 3   Olmesartan-amLODIPine-HCTZ 40-10-12.5 MG TABS Take 1 tablet by mouth daily. 30 tablet 11   potassium chloride SA (KLOR-CON M20) 20 MEQ tablet TAKE 1 TABLET BY MOUTH EVERY DAY 90 tablet 2   SYNTHROID 75 MCG tablet TAKE 1 TABLET DAILY BEFORE BREAKFAST (BRAND 90 tablet 3   vitamin E 400 UNIT capsule Take 400 Units by mouth daily.     amLODipine (NORVASC) 10 MG tablet Take 1 tablet (10 mg total) by mouth daily. 90 tablet 3   triamcinolone cream (KENALOG) 0.1 % Apply 1 application topically 2 (two) times daily. (Patient not taking: Reported on 04/14/2021) 80 g 3   No facility-administered medications prior to visit.    ROS: Review of Systems  Constitutional:  Negative for activity change, appetite change, chills, fatigue and unexpected weight change.  HENT:  Negative for congestion, mouth sores and sinus pressure.   Eyes:  Negative for visual disturbance.  Respiratory:   Negative for cough and chest tightness.   Gastrointestinal:  Negative for abdominal pain and nausea.  Genitourinary:  Negative for difficulty urinating, frequency and vaginal pain.  Musculoskeletal:  Negative for back pain and gait problem.  Skin:  Negative for pallor and rash.  Neurological:  Negative for dizziness, tremors, weakness, numbness and headaches.  Psychiatric/Behavioral:  Negative for confusion and sleep disturbance.    Objective:  BP 132/88 (BP Location: Left Arm)   Pulse 73   Temp 98.3 F (36.8 C) (Oral)   Ht 5\' 3"  (1.6 m)   Wt 155 lb 9.6 oz (70.6 kg)   LMP 02/05/2008   SpO2 95%   BMI 27.56 kg/m   BP Readings from Last 3 Encounters:  04/14/21 132/88  11/18/20 (!) 146/88  07/01/20 (!) 142/88    Wt Readings from Last 3 Encounters:  04/14/21 155 lb 9.6 oz (70.6 kg)  11/18/20 163 lb (73.9 kg)  07/01/20 170 lb 9.6 oz (77.4 kg)    Physical Exam Constitutional:      General: She is not in acute distress.    Appearance: She is well-developed.  HENT:     Head: Normocephalic.     Right Ear: External ear normal.     Left Ear: External ear normal.     Nose: Nose normal.  Eyes:     General:  Right eye: No discharge.        Left eye: No discharge.     Conjunctiva/sclera: Conjunctivae normal.     Pupils: Pupils are equal, round, and reactive to light.  Neck:     Thyroid: No thyromegaly.     Vascular: No JVD.     Trachea: No tracheal deviation.  Cardiovascular:     Rate and Rhythm: Normal rate and regular rhythm.     Heart sounds: Normal heart sounds.  Pulmonary:     Effort: No respiratory distress.     Breath sounds: No stridor. No wheezing.  Abdominal:     General: Bowel sounds are normal. There is no distension.     Palpations: Abdomen is soft. There is no mass.     Tenderness: There is no abdominal tenderness. There is no guarding or rebound.  Musculoskeletal:        General: No tenderness.     Cervical back: Normal range of motion and neck  supple. No rigidity.  Lymphadenopathy:     Cervical: No cervical adenopathy.  Skin:    Findings: No erythema or rash.  Neurological:     Cranial Nerves: No cranial nerve deficit.     Motor: No abnormal muscle tone.     Coordination: Coordination normal.     Deep Tendon Reflexes: Reflexes normal.  Psychiatric:        Behavior: Behavior normal.        Thought Content: Thought content normal.        Judgment: Judgment normal.    Lab Results  Component Value Date   WBC 6.4 11/18/2020   HGB 11.6 (L) 11/18/2020   HCT 35.1 (L) 11/18/2020   PLT 270.0 11/18/2020   GLUCOSE 81 11/18/2020   CHOL 151 11/18/2020   TRIG 53.0 11/18/2020   HDL 80.70 11/18/2020   LDLCALC 60 11/18/2020   ALT 14 11/18/2020   AST 19 11/18/2020   NA 139 11/18/2020   K 4.0 11/18/2020   CL 101 11/18/2020   CREATININE 1.16 11/18/2020   BUN 13 11/18/2020   CO2 25 11/18/2020   TSH 1.35 11/18/2020    VAS [place] LOWER EXTREMITY VENOUS (DVT)  Result Date: 03/12/2020  Lower Venous DVTStudy Other Indications: Patient presents with right knee and lower leg tenderness and                    swelling. She injured this leg about 3 months ago but it has                    been okay after that. She jumped rope recently and said a few                    days later is started to be painful behind the knee and she                    noticed the swelling proximal to and below the knee. The knee                    pops with walking and when getting up and down which has been                    painful. Risk Factors: None identified. Performing Technologist: Olegario Shearer RVT  Examination Guidelines: A complete evaluation includes B-mode imaging, spectral Doppler, color Doppler, and power Doppler as needed of all accessible portions of  each vessel. Bilateral testing is considered an integral part of a complete examination. Limited examinations for reoccurring indications may be performed as noted. The reflux portion of the exam is  performed with the patient in reverse Trendelenburg.  +---------+---------------+---------+-----------+----------+--------------+ RIGHT    CompressibilityPhasicitySpontaneityPropertiesThrombus Aging +---------+---------------+---------+-----------+----------+--------------+ CFV      Full           Yes      Yes                                 +---------+---------------+---------+-----------+----------+--------------+ SFJ      Full           Yes      Yes                                 +---------+---------------+---------+-----------+----------+--------------+ FV Prox  Full           Yes      Yes                                 +---------+---------------+---------+-----------+----------+--------------+ FV Mid   Full           Yes      Yes                                 +---------+---------------+---------+-----------+----------+--------------+ FV DistalFull           Yes      Yes                                 +---------+---------------+---------+-----------+----------+--------------+ PFV      Full                                                        +---------+---------------+---------+-----------+----------+--------------+ POP      Full           Yes      Yes                                 +---------+---------------+---------+-----------+----------+--------------+ PTV      Full           Yes      Yes                                 +---------+---------------+---------+-----------+----------+--------------+ PERO     Full           Yes      Yes                                 +---------+---------------+---------+-----------+----------+--------------+ Gastroc  Full                                                        +---------+---------------+---------+-----------+----------+--------------+  GSV      Full           Yes      Yes                                  +---------+---------------+---------+-----------+----------+--------------+   Right Technical Findings: A non-vascular, cystic structure is noted in the right popliteal fossa area measuring 4.8 x 2.0 cm.  +----+---------------+---------+-----------+----------+--------------+ LEFTCompressibilityPhasicitySpontaneityPropertiesThrombus Aging +----+---------------+---------+-----------+----------+--------------+ CFV Full           Yes      Yes                                 +----+---------------+---------+-----------+----------+--------------+     Summary: RIGHT: - No evidence of deep vein thrombosis in the lower extremity. No indirect evidence of obstruction proximal to the inguinal ligament. - A cystic structure is found in the popliteal fossa.  LEFT: - No evidence of common femoral vein obstruction.  *See table(s) above for measurements and observations. Electronically signed by Dina Rich MD on 03/12/2020 at 12:14:42 PM.    Final     Assessment & Plan:   Problem List Items Addressed This Visit     CRI (chronic renal insufficiency), stage 2 (mild)    Normal renal ultrasound - 2019 B is on HD Repeat labs today Hydrate well      Essential hypertension    Olmesartan 20 mg Amlodipine 10 mg HCTZ 12.5 mg  Start Tribenzor      Well adult exam - Primary    We discussed age appropriate health related issues, including available/recomended screening tests and vaccinations. We discussed a need for adhering to healthy diet and exercise. Labs/EKG were reviewed/ordered. All questions were answered. Colon 2013 - she had a problem with IV sedation "burning" all over.  Cologuard vs colonoscopy discussed      Relevant Orders   TSH   Urinalysis   CBC with Differential/Platelet   Lipid panel   Comprehensive metabolic panel      No orders of the defined types were placed in this encounter.     Follow-up: No follow-ups on file.  Sonda Primes, MD

## 2021-04-14 NOTE — Addendum Note (Signed)
Addended by: Madelon Lips on: 04/14/2021 04:26 PM   Modules accepted: Orders

## 2021-04-15 LAB — URINALYSIS
Bilirubin Urine: NEGATIVE
Hgb urine dipstick: NEGATIVE
Ketones, ur: NEGATIVE
Leukocytes,Ua: NEGATIVE
Nitrite: NEGATIVE
Specific Gravity, Urine: <=1.005 — AB (ref 1.000–1.030)
Total Protein, Urine: NEGATIVE
Urine Glucose: NEGATIVE
Urobilinogen, UA: 0.2 (ref 0.0–1.0)
pH: 5.5 (ref 5.0–8.0)

## 2021-04-15 LAB — CBC WITH DIFFERENTIAL/PLATELET
Basophils Absolute: 0.1 10*3/uL (ref 0.0–0.1)
Basophils Relative: 1.1 % (ref 0.0–3.0)
Eosinophils Absolute: 0.1 10*3/uL (ref 0.0–0.7)
Eosinophils Relative: 1.6 % (ref 0.0–5.0)
HCT: 36.4 % (ref 36.0–46.0)
Hemoglobin: 12.2 g/dL (ref 12.0–15.0)
Lymphocytes Relative: 38.7 % (ref 12.0–46.0)
Lymphs Abs: 2.3 10*3/uL (ref 0.7–4.0)
MCHC: 33.5 g/dL (ref 30.0–36.0)
MCV: 82.1 fl (ref 78.0–100.0)
Monocytes Absolute: 0.6 10*3/uL (ref 0.1–1.0)
Monocytes Relative: 10.6 % (ref 3.0–12.0)
Neutro Abs: 2.9 10*3/uL (ref 1.4–7.7)
Neutrophils Relative %: 48 % (ref 43.0–77.0)
Platelets: 268 10*3/uL (ref 150.0–400.0)
RBC: 4.43 Mil/uL (ref 3.87–5.11)
RDW: 14.9 % (ref 11.5–15.5)
WBC: 6 10*3/uL (ref 4.0–10.5)

## 2021-04-15 LAB — COMPREHENSIVE METABOLIC PANEL
ALT: 15 U/L (ref 0–35)
AST: 21 U/L (ref 0–37)
Albumin: 4.6 g/dL (ref 3.5–5.2)
Alkaline Phosphatase: 78 U/L (ref 39–117)
BUN: 14 mg/dL (ref 6–23)
CO2: 27 mEq/L (ref 19–32)
Calcium: 10.2 mg/dL (ref 8.4–10.5)
Chloride: 99 mEq/L (ref 96–112)
Creatinine, Ser: 1.18 mg/dL (ref 0.40–1.20)
GFR: 50.61 mL/min — ABNORMAL LOW (ref >60.00)
Glucose, Bld: 88 mg/dL (ref 70–99)
Potassium: 3.8 mEq/L (ref 3.5–5.1)
Sodium: 137 mEq/L (ref 135–145)
Total Bilirubin: 0.5 mg/dL (ref 0.2–1.2)
Total Protein: 7.8 g/dL (ref 6.0–8.3)

## 2021-04-15 LAB — LIPID PANEL
Cholesterol: 152 mg/dL (ref 0–200)
HDL: 82.8 mg/dL (ref >39.00)
LDL Cholesterol: 58 mg/dL (ref 0–99)
NonHDL: 69.46
Total CHOL/HDL Ratio: 2
Triglycerides: 59 mg/dL (ref 0.0–149.0)
VLDL: 11.8 mg/dL (ref 0.0–40.0)

## 2021-04-15 LAB — TSH: TSH: 0.2 u[IU]/mL — ABNORMAL LOW (ref 0.35–5.50)

## 2021-04-19 ENCOUNTER — Other Ambulatory Visit: Payer: Self-pay | Admitting: Internal Medicine

## 2021-04-19 DIAGNOSIS — E034 Atrophy of thyroid (acquired): Secondary | ICD-10-CM

## 2021-04-23 ENCOUNTER — Other Ambulatory Visit: Payer: Self-pay | Admitting: Internal Medicine

## 2021-07-13 ENCOUNTER — Ambulatory Visit: Payer: BC Managed Care – PPO | Admitting: Internal Medicine

## 2021-07-20 ENCOUNTER — Encounter: Payer: Self-pay | Admitting: Internal Medicine

## 2021-07-20 ENCOUNTER — Ambulatory Visit: Payer: BC Managed Care – PPO | Admitting: Internal Medicine

## 2021-07-20 ENCOUNTER — Other Ambulatory Visit: Payer: Self-pay

## 2021-07-20 DIAGNOSIS — N182 Chronic kidney disease, stage 2 (mild): Secondary | ICD-10-CM | POA: Diagnosis not present

## 2021-07-20 DIAGNOSIS — I10 Essential (primary) hypertension: Secondary | ICD-10-CM | POA: Diagnosis not present

## 2021-07-20 DIAGNOSIS — N183 Chronic kidney disease, stage 3 unspecified: Secondary | ICD-10-CM

## 2021-07-20 DIAGNOSIS — E034 Atrophy of thyroid (acquired): Secondary | ICD-10-CM

## 2021-07-20 NOTE — Assessment & Plan Note (Signed)
Cont on Levothroid Check TSH, FT4

## 2021-07-20 NOTE — Progress Notes (Addendum)
Subjective:  Patient ID: Tina Rivera, female    DOB: 1960-07-01  Age: 61 y.o. MRN: [id number]  CC: Follow-up (3 month f/u)   HPI Tina Rivera presents for HTN, CRI, hypothyroidism  Outpatient Medications Prior to Visit  Medication Sig Dispense Refill   acetaminophen (TYLENOL) 500 MG tablet Take 1,000 mg by mouth as needed for mild pain or headache.     cetirizine (ZYRTEC ALLERGY) 10 MG tablet Take 1 tablet (10 mg total) by mouth daily. 30 tablet 11   Cholecalciferol (VITAMIN D3) 1000 UNITS CAPS Take 1,000 Units by mouth daily.     fluticasone (FLONASE) 50 MCG/ACT nasal spray Place 2 sprays into both nostrils daily. 16 g 6   ketotifen (ZADITOR) 0.025 % ophthalmic solution Place 1 drop into both eyes as needed (itchy eyes).     levothyroxine (SYNTHROID) 75 MCG tablet TAKE 1 TABLET DAILY BEFORE BREAKFAST (BRAND) ANNUAL APPT W/LABS IS DUE 90 tablet 3   Olmesartan-amLODIPine-HCTZ 40-10-12.5 MG TABS Take 1 tablet by mouth daily. 30 tablet 11   potassium chloride SA (KLOR-CON M20) 20 MEQ tablet TAKE 1 TABLET BY MOUTH EVERY DAY 90 tablet 2   vitamin E 400 UNIT capsule Take 400 Units by mouth daily.     hydrochlorothiazide (MICROZIDE) 12.5 MG capsule TAKE 1 CAPSULE BY MOUTH EVERY DAY 90 capsule 3   naproxen (NAPROSYN) 500 MG tablet Take 1 tablet (500 mg total) by mouth 2 (two) times daily as needed (use pc). 60 tablet 2   olmesartan (BENICAR) 20 MG tablet Take 1 tablet (20 mg total) by mouth daily. 90 tablet 3   No facility-administered medications prior to visit.    ROS: Review of Systems  Constitutional:  Negative for activity change, appetite change, chills, fatigue and unexpected weight change.  HENT:  Negative for congestion, mouth sores and sinus pressure.   Eyes:  Negative for visual disturbance.  Respiratory:  Negative for cough and chest tightness.   Gastrointestinal:  Negative for abdominal pain and nausea.  Genitourinary:  Negative for difficulty urinating, frequency  and vaginal pain.  Musculoskeletal:  Negative for back pain and gait problem.  Skin:  Negative for pallor and rash.  Neurological:  Negative for dizziness, tremors, weakness, numbness and headaches.  Psychiatric/Behavioral:  Negative for confusion and sleep disturbance.    Objective:  BP 132/74 (BP Location: Left Arm)    Pulse 78    Temp 98 F (36.7 C) (Oral)    Ht 5\' 3"  (1.6 m)    Wt 159 lb 6.4 oz (72.3 kg)    LMP 02/05/2008    SpO2 97%    BMI 28.24 kg/m   BP Readings from Last 3 Encounters:  07/20/21 132/74  04/14/21 132/88  11/18/20 (!) 146/88    Wt Readings from Last 3 Encounters:  07/20/21 159 lb 6.4 oz (72.3 kg)  04/14/21 155 lb 9.6 oz (70.6 kg)  11/18/20 163 lb (73.9 kg)    Physical Exam Constitutional:      General: She is not in acute distress.    Appearance: She is well-developed.  HENT:     Head: Normocephalic.     Right Ear: External ear normal.     Left Ear: External ear normal.     Nose: Nose normal.  Eyes:     General:        Right eye: No discharge.        Left eye: No discharge.     Conjunctiva/sclera: Conjunctivae normal.  Pupils: Pupils are equal, round, and reactive to light.  Neck:     Thyroid: No thyromegaly.     Vascular: No JVD.     Trachea: No tracheal deviation.  Cardiovascular:     Rate and Rhythm: Normal rate and regular rhythm.     Heart sounds: Normal heart sounds.  Pulmonary:     Effort: No respiratory distress.     Breath sounds: No stridor. No wheezing.  Abdominal:     General: Bowel sounds are normal. There is no distension.     Palpations: Abdomen is soft. There is no mass.     Tenderness: There is no abdominal tenderness. There is no guarding or rebound.  Musculoskeletal:        General: No tenderness.     Cervical back: Normal range of motion and neck supple. No rigidity.  Lymphadenopathy:     Cervical: No cervical adenopathy.  Skin:    Findings: No erythema or rash.  Neurological:     Cranial Nerves: No cranial nerve  deficit.     Motor: No abnormal muscle tone.     Coordination: Coordination normal.     Deep Tendon Reflexes: Reflexes normal.  Psychiatric:        Behavior: Behavior normal.        Thought Content: Thought content normal.        Judgment: Judgment normal.    Lab Results  Component Value Date   WBC 6.0 04/14/2021   HGB 12.2 04/14/2021   HCT 36.4 04/14/2021   PLT 268.0 04/14/2021   GLUCOSE 85 07/21/2021   CHOL 152 04/14/2021   TRIG 59.0 04/14/2021   HDL 82.80 04/14/2021   LDLCALC 58 04/14/2021   ALT 25 07/21/2021   AST 23 07/21/2021   NA 139 07/21/2021   K 4.1 07/21/2021   CL 102 07/21/2021   CREATININE 1.26 (H) 07/21/2021   BUN 14 07/21/2021   CO2 28 07/21/2021   TSH 0.35 07/21/2021    VAS [place] LOWER EXTREMITY VENOUS (DVT)  Result Date: 03/12/2020  Lower Venous DVTStudy Other Indications: Patient presents with right knee and lower leg tenderness and                    swelling. She injured this leg about 3 months ago but it has                    been okay after that. She jumped rope recently and said a few                    days later is started to be painful behind the knee and she                    noticed the swelling proximal to and below the knee. The knee                    pops with walking and when getting up and down which has been                    painful. Risk Factors: None identified. Performing Technologist: Mariane Masters RVT  Examination Guidelines: A complete evaluation includes B-mode imaging, spectral Doppler, color Doppler, and power Doppler as needed of all accessible portions of each vessel. Bilateral testing is considered an integral part of a complete examination. Limited examinations for reoccurring indications may be performed as noted. The reflux portion of  the exam is performed with the patient in reverse Trendelenburg.  +---------+---------------+---------+-----------+----------+--------------+  RIGHT      Compressibility Phasicity Spontaneity Properties Thrombus Aging  +---------+---------------+---------+-----------+----------+--------------+  CFV       Full            Yes       Yes                                    +---------+---------------+---------+-----------+----------+--------------+  SFJ       Full            Yes       Yes                                    +---------+---------------+---------+-----------+----------+--------------+  FV Prox   Full            Yes       Yes                                    +---------+---------------+---------+-----------+----------+--------------+  FV Mid    Full            Yes       Yes                                    +---------+---------------+---------+-----------+----------+--------------+  FV Distal Full            Yes       Yes                                    +---------+---------------+---------+-----------+----------+--------------+  PFV       Full                                                             +---------+---------------+---------+-----------+----------+--------------+  POP       Full            Yes       Yes                                    +---------+---------------+---------+-----------+----------+--------------+  PTV       Full            Yes       Yes                                    +---------+---------------+---------+-----------+----------+--------------+  PERO      Full            Yes       Yes                                    +---------+---------------+---------+-----------+----------+--------------+  Gastroc  Full                                                             +---------+---------------+---------+-----------+----------+--------------+  GSV       Full            Yes       Yes                                    +---------+---------------+---------+-----------+----------+--------------+   Right Technical Findings: A non-vascular, cystic structure is noted in the right popliteal fossa area measuring 4.8 x 2.0 cm.   +----+---------------+---------+-----------+----------+--------------+  LEFT Compressibility Phasicity Spontaneity Properties Thrombus Aging  +----+---------------+---------+-----------+----------+--------------+  CFV  Full            Yes       Yes                                    +----+---------------+---------+-----------+----------+--------------+     Summary: RIGHT: - No evidence of deep vein thrombosis in the lower extremity. No indirect evidence of obstruction proximal to the inguinal ligament. - A cystic structure is found in the popliteal fossa.  LEFT: - No evidence of common femoral vein obstruction.  *See table(s) above for measurements and observations. Electronically signed by Carlyle Dolly MD on 03/12/2020 at 12:14:42 PM.    Final     Assessment & Plan:   Problem List Items Addressed This Visit     CRI (chronic renal insufficiency), stage 3 (moderate) ([place])    Cont w/ Tribenzor Monitor GFR Repeat labs  Hydrate well Brother is on HD: We will obtain a Nephrology consultation Normal renal ultrasound - 2019 2022 - GFR 46      Essential hypertension    Cont w/ Tribenzor Monitor GFR      Relevant Orders   Comprehensive metabolic panel (Completed)   Hypothyroidism    Cont on Levothroid Check TSH, FT4      Relevant Orders   Comprehensive metabolic panel (Completed)   TSH (Completed)   T4, free (Completed)      No orders of the defined types were placed in this encounter.     Follow-up: Return in about 6 months (around 01/17/2022) for a follow-up visit.  Walker Kehr, MD

## 2021-07-20 NOTE — Assessment & Plan Note (Addendum)
Cont w/ Tribenzor Monitor GFR Repeat labs  Hydrate well Brother is on HD: We will obtain a Nephrology consultation Normal renal ultrasound - 2019 2022 - GFR 46

## 2021-07-20 NOTE — Assessment & Plan Note (Signed)
Cont w/ Tribenzor Monitor GFR

## 2021-07-21 ENCOUNTER — Other Ambulatory Visit (INDEPENDENT_AMBULATORY_CARE_PROVIDER_SITE_OTHER): Payer: BC Managed Care – PPO

## 2021-07-21 DIAGNOSIS — E034 Atrophy of thyroid (acquired): Secondary | ICD-10-CM

## 2021-07-21 DIAGNOSIS — I10 Essential (primary) hypertension: Secondary | ICD-10-CM | POA: Diagnosis not present

## 2021-07-21 DIAGNOSIS — N182 Chronic kidney disease, stage 2 (mild): Secondary | ICD-10-CM | POA: Diagnosis not present

## 2021-07-21 LAB — TSH: TSH: 0.35 u[IU]/mL (ref 0.35–5.50)

## 2021-07-21 LAB — T4, FREE: Free T4: 1.17 ng/dL (ref 0.60–1.60)

## 2021-07-22 LAB — COMPREHENSIVE METABOLIC PANEL
ALT: 25 U/L (ref 0–35)
AST: 23 U/L (ref 0–37)
Albumin: 4.3 g/dL (ref 3.5–5.2)
Alkaline Phosphatase: 85 U/L (ref 39–117)
BUN: 14 mg/dL (ref 6–23)
CO2: 28 mEq/L (ref 19–32)
Calcium: 9.5 mg/dL (ref 8.4–10.5)
Chloride: 102 mEq/L (ref 96–112)
Creatinine, Ser: 1.26 mg/dL — ABNORMAL HIGH (ref 0.40–1.20)
GFR: 46.69 mL/min — ABNORMAL LOW (ref >60.00)
Glucose, Bld: 85 mg/dL (ref 70–99)
Potassium: 4.1 mEq/L (ref 3.5–5.1)
Sodium: 139 mEq/L (ref 135–145)
Total Bilirubin: 0.2 mg/dL (ref 0.2–1.2)
Total Protein: 7.1 g/dL (ref 6.0–8.3)

## 2021-09-13 ENCOUNTER — Telehealth: Payer: Self-pay | Admitting: Internal Medicine

## 2021-09-13 NOTE — Telephone Encounter (Signed)
Pt thinks she may be having a reaction to blood pressure medication.  ? ?States it's the same medication- but a different manufacturer.  ? ?Tried to call pharmacy- but was told that she needed to call into the office first.  ?

## 2021-09-14 NOTE — Telephone Encounter (Signed)
It is not very likely, however, possible.  Tina Rivera can stop this medication for couple days and see if her symptoms resolve.  She can check with the pharmacy and see how they can help her. ?Thanks ?

## 2021-09-16 NOTE — Telephone Encounter (Signed)
Spoke with pt and was able to inform her of Dr. Loren Racer advise. Understanding of the instructions was verbalized and the pt will call with an update Monday or Tuesday. ?

## 2021-09-19 NOTE — Telephone Encounter (Signed)
Pt states after stopping the bp med the skin reaction subsided  ? ?Pt states her bp reading as today is 180/120 ? ?Pt states she has no symptoms but "need something for my bp immediately"  ? ?Pharmacy [id number] - [place], [place] - [address] ?

## 2021-09-20 ENCOUNTER — Telehealth: Payer: Self-pay

## 2021-09-20 NOTE — Telephone Encounter (Signed)
Pts daughter called in very agitated inquiring about bp medication request ? ?Advised that we were waiting on pcp response and offered an appt, daughter became very upset stating "what is an appt going to do,Do I need to come up there because this will get handled today." ? ?Then disconnected the call ?

## 2021-09-20 NOTE — Telephone Encounter (Signed)
Awaiting PCP response

## 2021-09-20 NOTE — Telephone Encounter (Signed)
Pt daughter is calling on behalf of the pt. Pt is in need of a BP medication refill: ?Olmesartan-amLODIPine-HCTZ 40-10-12.5 MG TABS. ?Pt was advised to stop the medication 09/14/21 since she started having a skin reaction.  ?  ?Rash cleared up while not taking the medication. ?  ?Pt BP is elevated 180/120 and is now starting to complain headaches per her daughter. ?  ?Pharmacy: ?[id number] - , [place] - [address] ?  ?LOV 07/20/21 ?

## 2021-09-20 NOTE — Telephone Encounter (Signed)
Pts daughter checking status of request ? ?Caller states pts bp is still ranging 180/120 ? ?Offered an appt, caller declined ? ?Please advise ?

## 2021-09-21 NOTE — Telephone Encounter (Signed)
Pt daughter is calling on behalf of the pt. Pt is in need of a BP medication refill: ?Olmesartan-amLODIPine-HCTZ 40-10-12.5 MG TABS. ?Pt was advised to stop the medication 09/14/21 since she started having a skin reaction.  ?  ?Rash cleared up while not taking the medication. ?  ?Pt BP is elevated 180/120 and is now starting to complain headaches per her daughter. ?  ?Pharmacy: ?[id number] - [place], [place]. ?  ?LOV 07/20/21 ?

## 2021-09-22 MED ORDER — AMLODIPINE BESYLATE-VALSARTAN 5-320 MG PO TABS: 1.0000 | ORAL_TABLET | Freq: Every day | ORAL | 3 refills | Status: DC

## 2021-09-22 NOTE — Telephone Encounter (Signed)
See other message.  I change the medication to valsartan-amlodipine ?Thanks ?

## 2021-09-22 NOTE — Addendum Note (Signed)
Addended by: Tresa Garter on: 09/22/2021 07:51 AM ? ? Modules accepted: Orders ? ?

## 2021-09-22 NOTE — Telephone Encounter (Signed)
Exforge prescription emailed to the pharmacy.  Start taking 1 a day.  Please see me in 2 to 3 weeks.  Thanks ?

## 2021-10-03 ENCOUNTER — Encounter: Payer: Self-pay | Admitting: Internal Medicine

## 2021-10-13 ENCOUNTER — Ambulatory Visit: Payer: BC Managed Care – PPO | Admitting: Internal Medicine

## 2021-11-10 ENCOUNTER — Ambulatory Visit: Payer: BC Managed Care – PPO | Admitting: Internal Medicine

## 2021-11-10 ENCOUNTER — Encounter: Payer: Self-pay | Admitting: Internal Medicine

## 2021-11-10 VITALS — BP 140/90 | HR 90 | Temp 98.1°F | Ht 63.5 in | Wt 157.4 lb

## 2021-11-10 DIAGNOSIS — I10 Essential (primary) hypertension: Secondary | ICD-10-CM

## 2021-11-10 DIAGNOSIS — E034 Atrophy of thyroid (acquired): Secondary | ICD-10-CM | POA: Diagnosis not present

## 2021-11-10 DIAGNOSIS — N183 Chronic kidney disease, stage 3 unspecified: Secondary | ICD-10-CM | POA: Diagnosis not present

## 2021-11-10 LAB — COMPREHENSIVE METABOLIC PANEL
ALT: 27 U/L (ref 0–35)
AST: 25 U/L (ref 0–37)
Albumin: 4.4 g/dL (ref 3.5–5.2)
Alkaline Phosphatase: 85 U/L (ref 39–117)
BUN: 10 mg/dL (ref 6–23)
CO2: 28 mEq/L (ref 19–32)
Calcium: 9.6 mg/dL (ref 8.4–10.5)
Chloride: 105 mEq/L (ref 96–112)
Creatinine, Ser: 1.06 mg/dL (ref 0.40–1.20)
GFR: 57.33 mL/min — ABNORMAL LOW (ref >60.00)
Glucose, Bld: 90 mg/dL (ref 70–99)
Potassium: 4 mEq/L (ref 3.5–5.1)
Sodium: 141 mEq/L (ref 135–145)
Total Bilirubin: 0.4 mg/dL (ref 0.2–1.2)
Total Protein: 7.3 g/dL (ref 6.0–8.3)

## 2021-11-10 MED ORDER — CARVEDILOL 6.25 MG PO TABS: 6.2500 mg | ORAL_TABLET | Freq: Two times a day (BID) | ORAL | 3 refills | Status: DC

## 2021-11-10 NOTE — Assessment & Plan Note (Signed)
Cont on Levothroid 

## 2021-11-10 NOTE — Assessment & Plan Note (Addendum)
Nephrol ref was offered again Cont on Exforge Added Coreg Renin and aldost tests Consider 24 h urine for metanephrins Re-checked BP was better

## 2021-11-10 NOTE — Progress Notes (Signed)
Subjective:  Patient ID: Tina Rivera, female    DOB: Sep 17, 1960  Age: 61 y.o. MRN: [id number]  CC: Follow-up   HPI SHOSHANNAH KIELAR presents for HTN - high BP at times. F/u on CRI   Outpatient Medications Prior to Visit  Medication Sig Dispense Refill   acetaminophen (TYLENOL) 500 MG tablet Take 1,000 mg by mouth as needed for mild pain or headache.     amLODipine-valsartan (EXFORGE) 5-320 MG tablet Take 1 tablet by mouth daily. 90 tablet 3   cetirizine (ZYRTEC ALLERGY) 10 MG tablet Take 1 tablet (10 mg total) by mouth daily. 30 tablet 11   Cholecalciferol (VITAMIN D3) 1000 UNITS CAPS Take 1,000 Units by mouth daily.     fluticasone (FLONASE) 50 MCG/ACT nasal spray Place 2 sprays into both nostrils daily. 16 g 6   ketotifen (ZADITOR) 0.025 % ophthalmic solution Place 1 drop into both eyes as needed (itchy eyes).     levothyroxine (SYNTHROID) 75 MCG tablet TAKE 1 TABLET DAILY BEFORE BREAKFAST (BRAND) ANNUAL APPT W/LABS IS DUE 90 tablet 3   potassium chloride SA (KLOR-CON M20) 20 MEQ tablet TAKE 1 TABLET BY MOUTH EVERY DAY 90 tablet 2   vitamin E 400 UNIT capsule Take 400 Units by mouth daily.     No facility-administered medications prior to visit.    ROS: Review of Systems  Constitutional:  Negative for activity change, appetite change, chills, fatigue and unexpected weight change.  HENT:  Negative for congestion, mouth sores and sinus pressure.   Eyes:  Negative for visual disturbance.  Respiratory:  Negative for cough and chest tightness.   Gastrointestinal:  Negative for abdominal pain and nausea.  Genitourinary:  Negative for difficulty urinating, frequency and vaginal pain.  Musculoskeletal:  Negative for back pain and gait problem.  Skin:  Negative for pallor and rash.  Neurological:  Negative for dizziness, tremors, weakness, numbness and headaches.  Psychiatric/Behavioral:  Negative for confusion and sleep disturbance.    Objective:  BP 140/90   Pulse 90   Temp  98.1 F (36.7 C) (Oral)   Ht 5' 3.5" (1.613 m)   Wt 157 lb 6 oz (71.4 kg)   LMP 02/05/2008   SpO2 97%   BMI 27.44 kg/m   BP Readings from Last 3 Encounters:  11/10/21 140/90  07/20/21 132/74  04/14/21 132/88    Wt Readings from Last 3 Encounters:  11/10/21 157 lb 6 oz (71.4 kg)  07/20/21 159 lb 6.4 oz (72.3 kg)  04/14/21 155 lb 9.6 oz (70.6 kg)    Physical Exam Constitutional:      General: She is not in acute distress.    Appearance: Normal appearance. She is well-developed.  HENT:     Head: Normocephalic.     Right Ear: External ear normal.     Left Ear: External ear normal.     Nose: Nose normal.  Eyes:     General:        Right eye: No discharge.        Left eye: No discharge.     Conjunctiva/sclera: Conjunctivae normal.     Pupils: Pupils are equal, round, and reactive to light.  Neck:     Thyroid: No thyromegaly.     Vascular: No JVD.     Trachea: No tracheal deviation.  Cardiovascular:     Rate and Rhythm: Normal rate and regular rhythm.     Heart sounds: Normal heart sounds.  Pulmonary:     Effort: No  respiratory distress.     Breath sounds: No stridor. No wheezing.  Abdominal:     General: Bowel sounds are normal. There is no distension.     Palpations: Abdomen is soft. There is no mass.     Tenderness: There is no abdominal tenderness. There is no guarding or rebound.  Musculoskeletal:        General: No tenderness.     Cervical back: Normal range of motion and neck supple. No rigidity.  Lymphadenopathy:     Cervical: No cervical adenopathy.  Skin:    Findings: No erythema or rash.  Neurological:     Cranial Nerves: No cranial nerve deficit.     Motor: No abnormal muscle tone.     Coordination: Coordination normal.     Deep Tendon Reflexes: Reflexes normal.  Psychiatric:        Behavior: Behavior normal.        Thought Content: Thought content normal.        Judgment: Judgment normal.       A total time of 30 minutes was spent preparing  to see the patient, reviewing tests and other medical records.  Also, obtaining history and performing comprehensive physical exam.  Additionally, counseling the patient regarding the above listed issues - hard to control BP.   Finally, documenting clinical information in the health records.  Lab Results  Component Value Date   WBC 6.0 04/14/2021   HGB 12.2 04/14/2021   HCT 36.4 04/14/2021   PLT 268.0 04/14/2021   GLUCOSE 85 07/21/2021   CHOL 152 04/14/2021   TRIG 59.0 04/14/2021   HDL 82.80 04/14/2021   LDLCALC 58 04/14/2021   ALT 25 07/21/2021   AST 23 07/21/2021   NA 139 07/21/2021   K 4.1 07/21/2021   CL 102 07/21/2021   CREATININE 1.26 (H) 07/21/2021   BUN 14 07/21/2021   CO2 28 07/21/2021   TSH 0.35 07/21/2021    VAS [place] LOWER EXTREMITY VENOUS (DVT)  Result Date: 03/12/2020  Lower Venous DVTStudy Other Indications: Patient presents with right knee and lower leg tenderness and                    swelling. She injured this leg about 3 months ago but it has                    been okay after that. She jumped rope recently and said a few                    days later is started to be painful behind the knee and she                    noticed the swelling proximal to and below the knee. The knee                    pops with walking and when getting up and down which has been                    painful. Risk Factors: None identified. Performing Technologist: Mariane Masters RVT  Examination Guidelines: A complete evaluation includes B-mode imaging, spectral Doppler, color Doppler, and power Doppler as needed of all accessible portions of each vessel. Bilateral testing is considered an integral part of a complete examination. Limited examinations for reoccurring indications may be performed as noted. The reflux portion of the exam is performed with the patient  in reverse Trendelenburg.  +---------+---------------+---------+-----------+----------+--------------+ RIGHT     CompressibilityPhasicitySpontaneityPropertiesThrombus Aging +---------+---------------+---------+-----------+----------+--------------+ CFV      Full           Yes      Yes                                 +---------+---------------+---------+-----------+----------+--------------+ SFJ      Full           Yes      Yes                                 +---------+---------------+---------+-----------+----------+--------------+ FV Prox  Full           Yes      Yes                                 +---------+---------------+---------+-----------+----------+--------------+ FV Mid   Full           Yes      Yes                                 +---------+---------------+---------+-----------+----------+--------------+ FV DistalFull           Yes      Yes                                 +---------+---------------+---------+-----------+----------+--------------+ PFV      Full                                                        +---------+---------------+---------+-----------+----------+--------------+ POP      Full           Yes      Yes                                 +---------+---------------+---------+-----------+----------+--------------+ PTV      Full           Yes      Yes                                 +---------+---------------+---------+-----------+----------+--------------+ PERO     Full           Yes      Yes                                 +---------+---------------+---------+-----------+----------+--------------+ Gastroc  Full                                                        +---------+---------------+---------+-----------+----------+--------------+ GSV      Full           Yes  Yes                                 +---------+---------------+---------+-----------+----------+--------------+   Right Technical Findings: A non-vascular, cystic structure is noted in the right popliteal fossa area measuring 4.8 x 2.0 cm.   +----+---------------+---------+-----------+----------+--------------+ LEFTCompressibilityPhasicitySpontaneityPropertiesThrombus Aging +----+---------------+---------+-----------+----------+--------------+ CFV Full           Yes      Yes                                 +----+---------------+---------+-----------+----------+--------------+     Summary: RIGHT: - No evidence of deep vein thrombosis in the lower extremity. No indirect evidence of obstruction proximal to the inguinal ligament. - A cystic structure is found in the popliteal fossa.  LEFT: - No evidence of common femoral vein obstruction.  *See table(s) above for measurements and observations. Electronically signed by Carlyle Dolly MD on 03/12/2020 at 12:14:42 PM.    Final     Assessment & Plan:   Problem List Items Addressed This Visit     Hypothyroidism    Cont on Levothroid      Relevant Medications   carvedilol (COREG) 6.25 MG tablet   Hypertension, uncontrolled - Primary    Nephrol ref was offered again Cont on Exforge Added Coreg Renin and aldost tests Consider 24 h urine for metanephrins Re-checked BP was better      Relevant Medications   carvedilol (COREG) 6.25 MG tablet   CRI (chronic renal insufficiency), stage 3 (moderate) (HCC)    Repeat labs  Hydrate well Nephrol ref was offered again          Meds ordered this encounter  Medications   carvedilol (COREG) 6.25 MG tablet    Sig: Take 1 tablet (6.25 mg total) by mouth 2 (two) times daily with a meal.    Dispense:  180 tablet    Refill:  3      Follow-up: Return in about 3 months (around 02/10/2022) for a follow-up visit.  Walker Kehr, MD

## 2021-11-10 NOTE — Assessment & Plan Note (Signed)
Repeat labs  Hydrate well Nephrol ref was offered again

## 2021-11-24 LAB — ALDOSTERONE + RENIN ACTIVITY W/ RATIO
ALDO / PRA Ratio: 3.1 Ratio (ref 0.9–28.9)
Aldosterone: 3 ng/dL
Renin Activity: 0.97 ng/mL/h (ref 0.25–5.82)

## 2021-12-25 ENCOUNTER — Other Ambulatory Visit: Payer: Self-pay | Admitting: Internal Medicine

## 2022-01-16 ENCOUNTER — Encounter: Payer: Self-pay | Admitting: Internal Medicine

## 2022-01-22 ENCOUNTER — Other Ambulatory Visit: Payer: Self-pay | Admitting: Internal Medicine

## 2022-01-22 MED ORDER — CARVEDILOL 12.5 MG PO TABS: 12.5000 mg | ORAL_TABLET | Freq: Two times a day (BID) | ORAL | 3 refills | Status: DC

## 2022-02-02 ENCOUNTER — Ambulatory Visit: Payer: BC Managed Care – PPO | Admitting: Internal Medicine

## 2022-02-02 ENCOUNTER — Encounter: Payer: Self-pay | Admitting: Internal Medicine

## 2022-02-02 DIAGNOSIS — N183 Chronic kidney disease, stage 3 unspecified: Secondary | ICD-10-CM

## 2022-02-02 DIAGNOSIS — E034 Atrophy of thyroid (acquired): Secondary | ICD-10-CM | POA: Diagnosis not present

## 2022-02-02 DIAGNOSIS — I10 Essential (primary) hypertension: Secondary | ICD-10-CM | POA: Diagnosis not present

## 2022-02-02 MED ORDER — CARVEDILOL 25 MG PO TABS: 25.0000 mg | ORAL_TABLET | Freq: Two times a day (BID) | ORAL | 3 refills | Status: DC

## 2022-02-02 NOTE — Assessment & Plan Note (Signed)
Will increase Coreg for better BP control

## 2022-02-02 NOTE — Assessment & Plan Note (Signed)
Cont w/ Exforge Added Coreg

## 2022-02-02 NOTE — Assessment & Plan Note (Signed)
Cont on Levothroid 

## 2022-02-02 NOTE — Progress Notes (Signed)
Subjective:  Patient ID: Tina Rivera, female    DOB: 15-May-1961  Age: 61 y.o. MRN: [id number]  CC: No chief complaint on file.   HPI Tina Rivera presents for CRI, HTN, hypothyroidism  Outpatient Medications Prior to Visit  Medication Sig Dispense Refill   acetaminophen (TYLENOL) 500 MG tablet Take 1,000 mg by mouth as needed for mild pain or headache.     amLODipine-valsartan (EXFORGE) 5-320 MG tablet Take 1 tablet by mouth daily. 90 tablet 3   Cholecalciferol (VITAMIN D3) 1000 UNITS CAPS Take 1,000 Units by mouth daily.     fluticasone (FLONASE) 50 MCG/ACT nasal spray Place 2 sprays into both nostrils daily. 16 g 6   ketotifen (ZADITOR) 0.025 % ophthalmic solution Place 1 drop into both eyes as needed (itchy eyes).     levothyroxine (SYNTHROID) 75 MCG tablet TAKE 1 TABLET DAILY BEFORE BREAKFAST (BRAND) ANNUAL APPT W/LABS IS DUE 90 tablet 3   naproxen (NAPROSYN) 500 MG tablet TAKE 1 TABLET (500 MG TOTAL) BY MOUTH 2 (TWO) TIMES DAILY AS NEEDED (USE PC).     potassium chloride SA (KLOR-CON M20) 20 MEQ tablet TAKE 1 TABLET BY MOUTH EVERY DAY Annual appt due in Oct must see provider for future refills 90 tablet 0   vitamin E 400 UNIT capsule Take 400 Units by mouth daily.     carvedilol (COREG) 12.5 MG tablet Take 1 tablet (12.5 mg total) by mouth 2 (two) times daily with a meal. 180 tablet 3   Olmesartan-amLODIPine-HCTZ 40-10-12.5 MG TABS Take 1 tablet by mouth daily.     cetirizine (ZYRTEC ALLERGY) 10 MG tablet Take 1 tablet (10 mg total) by mouth daily. 30 tablet 11   No facility-administered medications prior to visit.    ROS: Review of Systems  Constitutional:  Negative for activity change, appetite change, chills, fatigue and unexpected weight change.  HENT:  Negative for congestion, mouth sores and sinus pressure.   Eyes:  Negative for visual disturbance.  Respiratory:  Negative for cough and chest tightness.   Gastrointestinal:  Negative for abdominal pain and nausea.   Genitourinary:  Negative for difficulty urinating, frequency and vaginal pain.  Musculoskeletal:  Negative for back pain and gait problem.  Skin:  Negative for pallor and rash.  Neurological:  Negative for dizziness, tremors, weakness, numbness and headaches.  Psychiatric/Behavioral:  Negative for confusion and sleep disturbance.     Objective:  BP 130/82 (BP Location: Left Arm, Patient Position: Sitting, Cuff Size: Large)   Pulse 61   Temp 98.7 F (37.1 C) (Oral)   Ht 5' 3.5" (1.613 m)   Wt 154 lb (69.9 kg)   LMP 02/05/2008   SpO2 97%   BMI 26.85 kg/m   BP Readings from Last 3 Encounters:  02/02/22 130/82  11/10/21 140/90  07/20/21 132/74    Wt Readings from Last 3 Encounters:  02/02/22 154 lb (69.9 kg)  11/10/21 157 lb 6 oz (71.4 kg)  07/20/21 159 lb 6.4 oz (72.3 kg)    Physical Exam Constitutional:      General: She is not in acute distress.    Appearance: She is well-developed.  HENT:     Head: Normocephalic.     Right Ear: External ear normal.     Left Ear: External ear normal.     Nose: Nose normal.  Eyes:     General:        Right eye: No discharge.        Left eye:  No discharge.     Conjunctiva/sclera: Conjunctivae normal.     Pupils: Pupils are equal, round, and reactive to light.  Neck:     Thyroid: No thyromegaly.     Vascular: No JVD.     Trachea: No tracheal deviation.  Cardiovascular:     Rate and Rhythm: Normal rate and regular rhythm.     Heart sounds: Normal heart sounds.  Pulmonary:     Effort: No respiratory distress.     Breath sounds: No stridor. No wheezing.  Abdominal:     General: Bowel sounds are normal. There is no distension.     Palpations: Abdomen is soft. There is no mass.     Tenderness: There is no abdominal tenderness. There is no guarding or rebound.  Musculoskeletal:        General: No tenderness.     Cervical back: Normal range of motion and neck supple. No rigidity.  Lymphadenopathy:     Cervical: No cervical  adenopathy.  Skin:    Findings: No erythema or rash.  Neurological:     Mental Status: She is oriented to person, place, and time.     Cranial Nerves: No cranial nerve deficit.     Motor: No abnormal muscle tone.     Coordination: Coordination normal.     Deep Tendon Reflexes: Reflexes normal.  Psychiatric:        Behavior: Behavior normal.        Thought Content: Thought content normal.        Judgment: Judgment normal.     Lab Results  Component Value Date   WBC 6.0 04/14/2021   HGB 12.2 04/14/2021   HCT 36.4 04/14/2021   PLT 268.0 04/14/2021   GLUCOSE 90 11/10/2021   CHOL 152 04/14/2021   TRIG 59.0 04/14/2021   HDL 82.80 04/14/2021   LDLCALC 58 04/14/2021   ALT 27 11/10/2021   AST 25 11/10/2021   NA 141 11/10/2021   K 4.0 11/10/2021   CL 105 11/10/2021   CREATININE 1.06 11/10/2021   BUN 10 11/10/2021   CO2 28 11/10/2021   TSH 0.35 07/21/2021    VAS [place] LOWER EXTREMITY VENOUS (DVT)  Result Date: 03/12/2020  Lower Venous DVTStudy Other Indications: Patient presents with right knee and lower leg tenderness and                    swelling. She injured this leg about 3 months ago but it has                    been okay after that. She jumped rope recently and said a few                    days later is started to be painful behind the knee and she                    noticed the swelling proximal to and below the knee. The knee                    pops with walking and when getting up and down which has been                    painful. Risk Factors: None identified. Performing Technologist: Olegario Shearer RVT  Examination Guidelines: A complete evaluation includes B-mode imaging, spectral Doppler, color Doppler, and power Doppler as needed of all accessible portions of  each vessel. Bilateral testing is considered an integral part of a complete examination. Limited examinations for reoccurring indications may be performed as noted. The reflux portion of the exam is performed with  the patient in reverse Trendelenburg.  +---------+---------------+---------+-----------+----------+--------------+ RIGHT    CompressibilityPhasicitySpontaneityPropertiesThrombus Aging +---------+---------------+---------+-----------+----------+--------------+ CFV      Full           Yes      Yes                                 +---------+---------------+---------+-----------+----------+--------------+ SFJ      Full           Yes      Yes                                 +---------+---------------+---------+-----------+----------+--------------+ FV Prox  Full           Yes      Yes                                 +---------+---------------+---------+-----------+----------+--------------+ FV Mid   Full           Yes      Yes                                 +---------+---------------+---------+-----------+----------+--------------+ FV DistalFull           Yes      Yes                                 +---------+---------------+---------+-----------+----------+--------------+ PFV      Full                                                        +---------+---------------+---------+-----------+----------+--------------+ POP      Full           Yes      Yes                                 +---------+---------------+---------+-----------+----------+--------------+ PTV      Full           Yes      Yes                                 +---------+---------------+---------+-----------+----------+--------------+ PERO     Full           Yes      Yes                                 +---------+---------------+---------+-----------+----------+--------------+ Gastroc  Full                                                        +---------+---------------+---------+-----------+----------+--------------+  GSV      Full           Yes      Yes                                 +---------+---------------+---------+-----------+----------+--------------+   Right  Technical Findings: A non-vascular, cystic structure is noted in the right popliteal fossa area measuring 4.8 x 2.0 cm.  +----+---------------+---------+-----------+----------+--------------+ LEFTCompressibilityPhasicitySpontaneityPropertiesThrombus Aging +----+---------------+---------+-----------+----------+--------------+ CFV Full           Yes      Yes                                 +----+---------------+---------+-----------+----------+--------------+     Summary: RIGHT: - No evidence of deep vein thrombosis in the lower extremity. No indirect evidence of obstruction proximal to the inguinal ligament. - A cystic structure is found in the popliteal fossa.  LEFT: - No evidence of common femoral vein obstruction.  *See table(s) above for measurements and observations. Electronically signed by Dina Rich MD on 03/12/2020 at 12:14:42 PM.    Final     Assessment & Plan:   Problem List Items Addressed This Visit     CRI (chronic renal insufficiency), stage 3 (moderate) (HCC)    Will increase Coreg for better BP control      Hypertension, uncontrolled      Cont w/ Exforge Added Coreg      Relevant Medications   carvedilol (COREG) 25 MG tablet      Meds ordered this encounter  Medications   carvedilol (COREG) 25 MG tablet    Sig: Take 1 tablet (25 mg total) by mouth 2 (two) times daily.    Dispense:  180 tablet    Refill:  3      Follow-up: Return in about 6 months (around 08/05/2022) for a follow-up visit.  Sonda Primes, MD

## 2022-02-22 ENCOUNTER — Telehealth: Payer: Self-pay

## 2022-02-22 NOTE — Telephone Encounter (Signed)
LVM for pt to contact the clinic with updated Mammogram info or if she is needing help to have one done.

## 2022-03-19 ENCOUNTER — Encounter: Payer: Self-pay | Admitting: Internal Medicine

## 2022-03-21 ENCOUNTER — Ambulatory Visit: Payer: BC Managed Care – PPO | Admitting: Family Medicine

## 2022-03-21 ENCOUNTER — Encounter: Payer: Self-pay | Admitting: Family Medicine

## 2022-03-21 VITALS — BP 152/100 | HR 60 | Temp 97.5°F | Ht 63.5 in | Wt 156.0 lb

## 2022-03-21 DIAGNOSIS — T7840XA Allergy, unspecified, initial encounter: Secondary | ICD-10-CM

## 2022-03-21 DIAGNOSIS — R21 Rash and other nonspecific skin eruption: Secondary | ICD-10-CM | POA: Diagnosis not present

## 2022-03-21 DIAGNOSIS — I10 Essential (primary) hypertension: Secondary | ICD-10-CM | POA: Diagnosis not present

## 2022-03-21 MED ORDER — AMLODIPINE BESYLATE 10 MG PO TABS: 10.0000 mg | ORAL_TABLET | Freq: Every day | ORAL | 1 refills | Status: DC

## 2022-03-21 NOTE — Telephone Encounter (Signed)
FYI

## 2022-03-21 NOTE — Progress Notes (Signed)
Subjective:     Patient ID: Tina Rivera, female    DOB: 1961/01/17, 61 y.o.   MRN: [id number]  Chief Complaint  Patient presents with   Urticaria    Hives on arms for about a week and a half. Has been using benadryl and cream Dr.Plot prescribed which has not helped. Mentions hx of hives when BP medication pharmaceutical company was changed so believes it may be the medication as she recently increased dosage.     HPI Patient is in today for a 1 1/2 week hx of pruritic rash on her arms. She has had a similar reaction in the past. States she has tingling lips. No edema or lips, tongue or throat.  States her valsartan-amlodipine tablets changed recently due to the manufacturer prior to onset of rash. States she stopped the medication 3 days ago and the rash has improved. Her BP has not been well controlled.   Taking Benadryl by mouth and using a cream.   Denies fever, chills, dizziness, chest pain, palpitations, shortness of breath, abdominal pain, N/V/D, LE edema.    Health Maintenance Due  Topic Date Due   Hepatitis C Screening  Never done   Zoster Vaccines- Shingrix (1 of 2) Never done   MAMMOGRAM  09/15/2021   TETANUS/TDAP  02/04/2022    Past Medical History:  Diagnosis Date   Acute upper respiratory infections of unspecified site    Allergic rhinitis, cause unspecified    Asymptomatic postmenopausal status (age-related) (natural)    Cough    Family history of asthma    Migraine without aura, without mention of intractable migraine without mention of status migrainosus    Pain in joint, pelvic region and thigh    Thyrotoxicosis without mention of goiter or other cause, without mention of thyrotoxic crisis or storm    Unspecified essential hypertension    Warts     Past Surgical History:  Procedure Laterality Date   CESAREAN SECTION  7544,[id number]   x2    Family History  Problem Relation Age of Onset   Asthma Mother    Asthma Sister    Hypertension Other     Colon cancer Neg Hx    Diabetes Neg Hx    Rectal cancer Neg Hx    Stomach cancer Neg Hx     Social History   Socioeconomic History   Marital status: Divorced    Spouse name: Not on file   Number of children: Not on file   Years of education: Not on file   Highest education level: Not on file  Occupational History   Occupation: Runner, broadcasting/film/video aid  Tobacco Use   Smoking status: Never   Smokeless tobacco: Never  Substance and Sexual Activity   Alcohol use: No   Drug use: No   Sexual activity: Yes  Other Topics Concern   Not on file  Social History Narrative   Does drink fair amount of caffeine   Social Determinants of Health   Financial Resource Strain: Not on file  Food Insecurity: Not on file  Transportation Needs: Not on file  Physical Activity: Not on file  Stress: Not on file  Social Connections: Not on file  Intimate Partner Violence: Not on file    Outpatient Medications Prior to Visit  Medication Sig Dispense Refill   acetaminophen (TYLENOL) 500 MG tablet Take 1,000 mg by mouth as needed for mild pain or headache.     carvedilol (COREG) 25 MG tablet Take 1 tablet (25 mg  total) by mouth 2 (two) times daily. 180 tablet 3   Cholecalciferol (VITAMIN D3) 1000 UNITS CAPS Take 1,000 Units by mouth daily.     fluticasone (FLONASE) 50 MCG/ACT nasal spray Place 2 sprays into both nostrils daily. 16 g 6   ketotifen (ZADITOR) 0.025 % ophthalmic solution Place 1 drop into both eyes as needed (itchy eyes).     levothyroxine (SYNTHROID) 75 MCG tablet TAKE 1 TABLET DAILY BEFORE BREAKFAST (BRAND) ANNUAL APPT W/LABS IS DUE 90 tablet 3   naproxen (NAPROSYN) 500 MG tablet TAKE 1 TABLET (500 MG TOTAL) BY MOUTH 2 (TWO) TIMES DAILY AS NEEDED (USE PC).     potassium chloride SA (KLOR-CON M20) 20 MEQ tablet TAKE 1 TABLET BY MOUTH EVERY DAY Annual appt due in Oct must see provider for future refills 90 tablet 0   vitamin E 400 UNIT capsule Take 400 Units by mouth daily.     amLODipine-valsartan  (EXFORGE) 5-320 MG tablet Take 1 tablet by mouth daily. 90 tablet 3   No facility-administered medications prior to visit.    Allergies  Allergen Reactions   Codeine Hives and Nausea Only   Propofol     Burning all over feeling and IV pain during colonoscopy sedation (before she went out)    ROS     Objective:    Physical Exam Constitutional:      General: She is not in acute distress.    Appearance: She is not ill-appearing.  HENT:     Mouth/Throat:     Lips: Pink.     Mouth: Mucous membranes are moist. No angioedema.     Pharynx: Oropharynx is clear. No pharyngeal swelling.  Cardiovascular:     Rate and Rhythm: Normal rate and regular rhythm.  Pulmonary:     Effort: Pulmonary effort is normal.     Breath sounds: Normal breath sounds.  Musculoskeletal:     Cervical back: Normal range of motion and neck supple.  Lymphadenopathy:     Cervical: No cervical adenopathy.  Skin:    General: Skin is warm and dry.     Findings: Rash present.     Comments: Bilateral arms with flat, mildly red rash.   Neurological:     General: No focal deficit present.     Mental Status: She is alert and oriented to person, place, and time.  Psychiatric:        Mood and Affect: Mood normal.        Behavior: Behavior normal.        Thought Content: Thought content normal.     BP (!) 152/100 (BP Location: Left Arm, Patient Position: Sitting, Cuff Size: Large) Comment: no BP meds since 10/1  Pulse 60   Temp (!) 97.5 F (36.4 C) (Temporal)   Ht 5' 3.5" (1.613 m)   Wt 156 lb (70.8 kg)   LMP 02/05/2008   SpO2 97%   BMI 27.20 kg/m  Wt Readings from Last 3 Encounters:  03/21/22 156 lb (70.8 kg)  02/02/22 154 lb (69.9 kg)  11/10/21 157 lb 6 oz (71.4 kg)       Assessment & Plan:   Problem List Items Addressed This Visit       Cardiovascular and Mediastinum   Hypertension, uncontrolled   Relevant Medications   amLODipine (NORVASC) 10 MG tablet   Other Visit Diagnoses     Rash  due to allergy    -  Primary      Discussed that it is difficult  to know exactly what her reaction is from but her medication was recently changed by the manufacturer making it more likely that the Exforge could be the cause. She will stop this medication.  Amlodipine 10 mg prescribed. She has been on HCTZ in the past and unclear as to why it was stopped so we will not restart this medication.  Advised to keep an eye on symptoms and BP at home.  She may take OTC Zyrtec bid.  Follow up with PCP in 2-3 weeks.   Visit time 20 minutes in face to face communication with patient and coordination of care, additional 10 minutes spent in record review, coordination or care, ordering tests, communicating/referring to other healthcare professionals, documenting in medical records all on the same day of the visit for total time 30 minutes spent on the visit.    I have discontinued Star L. Allor's amLODipine-valsartan. I am also having her start on amLODipine. Additionally, I am having her maintain her vitamin E, Vitamin D3, ketotifen, acetaminophen, fluticasone, levothyroxine, potassium chloride SA, naproxen, and carvedilol.  Meds ordered this encounter  Medications   amLODipine (NORVASC) 10 MG tablet    Sig: Take 1 tablet (10 mg total) by mouth daily.    Dispense:  30 tablet    Refill:  1    Order Specific Question:   Supervising Provider    Answer:   Hillard Danker A [[id number]]

## 2022-03-21 NOTE — Telephone Encounter (Signed)
Patient is scheduled for an appointment today with Vickie.   Nurse Assessment Nurse: Ahorlu, RN, Shellia Cleverly Date/Time (Eastern Time): 03/20/2022 4:17:51 PM Confirm and document reason for call. If symptomatic, describe symptoms. ---Caller states she has broken out in hives on her arms and legs x 1.5 weeks. Patient is taking blood pressure medication Valsartan 5mg  / 320mg  and Carvedilol 25mg . (taking Benadryl and cream that prescribed) Does the patient have any new or worsening symptoms? ---Yes Will a triage be completed? ---Yes Related visit to physician within the last 2 weeks? ---No Does the PT have any chronic conditions? (i.e. diabetes, asthma, this includes High risk factors for pregnancy, etc.) ---Yes List chronic conditions. ---hypertension, thyroid disease vasovagal syncope Is this a behavioral health or substance abuse call? ---No  Guidelines Guideline Title Affirmed Question Affirmed Notes Nurse Date/Time (Eastern Time) Hives [1] MODERATESEVERE hives persist (i.e., hives interfere with normal activities or work) [2] taking antihistamine (e.g., Ahorlu, RN, Shellia Cleverly 03/20/2022 4:21:32 PM)  Guidelines Guideline Title Affirmed Question Affirmed Notes Nurse Date/Time Eilene Ghazi Time) Benadryl, Claritin) > 24 hours Disp. Time Eilene Ghazi Time) Disposition Final User 03/20/2022 4:24:59 PM See PCP within 24 Hours Yes Ahorlu, RN, Shellia Cleverly Final Disposition 03/20/2022 4:24:59 PM See PCP within 24 Hours Yes Ahorlu, RN, Shellia Cleverly  Caller Disagree/Comply Comply Caller Understands Yes PreDisposition Call Doctor Care Advice Given Per Guideline SEE PCP WITHIN 24 HOURS: * IF OFFICE WILL BE OPEN: You need to be examined within the next 24 hours. Call your doctor (or NP/PA) when the office opens and make an appointment. ANTIHISTAMINE MEDICINES FOR WIDESPREAD HIVES: * For widespread hives, you can take cetirizine, fexofendadine, or loratadine. They can help reduce the rash and any itching. CALL BACK IF: *  You become worse CARE ADVICE given per Hives (Adult) guideline. Comments User: Bing Neighbors, RN Date/Time Eilene Ghazi Time): 03/20/2022 4:28:16 PM warm transferred called to Mitzi Hansen who will discuss appointment options with patient Referrals Warm transfer to backline

## 2022-03-21 NOTE — Patient Instructions (Signed)
Stop taking the valsartan-amlodipine (Exforge) medication.  Start taking amlodipine 10 mg once daily.  Keep an eye on your blood pressure at home.  Try Zyrtec over-the-counter twice daily and you may also try Pepcid once daily.  Follow-up with Dr. Alain Marion in the next 2 to 3 weeks.

## 2022-03-23 ENCOUNTER — Other Ambulatory Visit: Payer: Self-pay | Admitting: Internal Medicine

## 2022-04-05 ENCOUNTER — Ambulatory Visit: Payer: BC Managed Care – PPO | Admitting: Internal Medicine

## 2022-04-05 ENCOUNTER — Encounter: Payer: Self-pay | Admitting: Internal Medicine

## 2022-04-05 DIAGNOSIS — R21 Rash and other nonspecific skin eruption: Secondary | ICD-10-CM

## 2022-04-05 DIAGNOSIS — I10 Essential (primary) hypertension: Secondary | ICD-10-CM

## 2022-04-05 MED ORDER — TRIAMCINOLONE ACETONIDE 0.1 % EX CREA: 1.0000 | TOPICAL_CREAM | Freq: Three times a day (TID) | CUTANEOUS | 3 refills | Status: DC

## 2022-04-05 MED ORDER — VALSARTAN 160 MG PO TABS: 160.0000 mg | ORAL_TABLET | Freq: Every day | ORAL | 3 refills | Status: DC

## 2022-04-05 NOTE — Progress Notes (Addendum)
Subjective:  Patient ID: Tina Rivera, female    DOB: 02-12-1961  Age: 61 y.o. MRN: [id number]  CC: Hypertension (2 week f/u)   HPI Tina Rivera presents for rash arms Off everything except for Norvasc Pt stopped Exforge and Carvedilol - the rah is better  Outpatient Medications Prior to Visit  Medication Sig Dispense Refill   acetaminophen (TYLENOL) 500 MG tablet Take 1,000 mg by mouth as needed for mild pain or headache.     amLODipine (NORVASC) 10 MG tablet Take 1 tablet (10 mg total) by mouth daily. 30 tablet 1   Cholecalciferol (VITAMIN D3) 1000 UNITS CAPS Take 1,000 Units by mouth daily.     fluticasone (FLONASE) 50 MCG/ACT nasal spray Place 2 sprays into both nostrils daily. 16 g 6   ketotifen (ZADITOR) 0.025 % ophthalmic solution Place 1 drop into both eyes as needed (itchy eyes).     levothyroxine (SYNTHROID) 75 MCG tablet TAKE 1 TABLET DAILY BEFORE BREAKFAST (BRAND) ANNUAL APPT W/LABS IS DUE 90 tablet 3   naproxen (NAPROSYN) 500 MG tablet TAKE 1 TABLET (500 MG TOTAL) BY MOUTH 2 (TWO) TIMES DAILY AS NEEDED (USE PC).     potassium chloride SA (KLOR-CON M20) 20 MEQ tablet Take 1 tablet (20 mEq total) by mouth daily. TAKE 1 TABLET BY MOUTH EVERY DAY 90 tablet 3   vitamin E 400 UNIT capsule Take 400 Units by mouth daily.     carvedilol (COREG) 25 MG tablet Take 1 tablet (25 mg total) by mouth 2 (two) times daily. 180 tablet 3   No facility-administered medications prior to visit.    ROS: Review of Systems  Objective:  BP (!) 138/98 (BP Location: Left Arm)   Pulse 91   Temp 98.5 F (36.9 C) (Oral)   LMP 02/05/2008   SpO2 97%   BP Readings from Last 3 Encounters:  04/05/22 (!) 138/98  03/21/22 (!) 152/100  02/02/22 130/82    Wt Readings from Last 3 Encounters:  03/21/22 156 lb (70.8 kg)  02/02/22 154 lb (69.9 kg)  11/10/21 157 lb 6 oz (71.4 kg)    Physical Exam  Lab Results  Component Value Date   WBC 6.0 04/14/2021   HGB 12.2 04/14/2021   HCT 36.4  04/14/2021   PLT 268.0 04/14/2021   GLUCOSE 90 11/10/2021   CHOL 152 04/14/2021   TRIG 59.0 04/14/2021   HDL 82.80 04/14/2021   LDLCALC 58 04/14/2021   ALT 27 11/10/2021   AST 25 11/10/2021   NA 141 11/10/2021   K 4.0 11/10/2021   CL 105 11/10/2021   CREATININE 1.06 11/10/2021   BUN 10 11/10/2021   CO2 28 11/10/2021   TSH 0.35 07/21/2021    VAS [place] LOWER EXTREMITY VENOUS (DVT)  Result Date: 03/12/2020  Lower Venous DVTStudy Other Indications: Patient presents with right knee and lower leg tenderness and                    swelling. She injured this leg about 3 months ago but it has                    been okay after that. She jumped rope recently and said a few                    days later is started to be painful behind the knee and she  noticed the swelling proximal to and below the knee. The knee                    pops with walking and when getting up and down which has been                    painful. Risk Factors: None identified. Performing Technologist: Mariane Masters RVT  Examination Guidelines: A complete evaluation includes B-mode imaging, spectral Doppler, color Doppler, and power Doppler as needed of all accessible portions of each vessel. Bilateral testing is considered an integral part of a complete examination. Limited examinations for reoccurring indications may be performed as noted. The reflux portion of the exam is performed with the patient in reverse Trendelenburg.  +---------+---------------+---------+-----------+----------+--------------+ RIGHT    CompressibilityPhasicitySpontaneityPropertiesThrombus Aging +---------+---------------+---------+-----------+----------+--------------+ CFV      Full           Yes      Yes                                 +---------+---------------+---------+-----------+----------+--------------+ SFJ      Full           Yes      Yes                                  +---------+---------------+---------+-----------+----------+--------------+ FV Prox  Full           Yes      Yes                                 +---------+---------------+---------+-----------+----------+--------------+ FV Mid   Full           Yes      Yes                                 +---------+---------------+---------+-----------+----------+--------------+ FV DistalFull           Yes      Yes                                 +---------+---------------+---------+-----------+----------+--------------+ PFV      Full                                                        +---------+---------------+---------+-----------+----------+--------------+ POP      Full           Yes      Yes                                 +---------+---------------+---------+-----------+----------+--------------+ PTV      Full           Yes      Yes                                 +---------+---------------+---------+-----------+----------+--------------+ PERO     Full  Yes      Yes                                 +---------+---------------+---------+-----------+----------+--------------+ Gastroc  Full                                                        +---------+---------------+---------+-----------+----------+--------------+ GSV      Full           Yes      Yes                                 +---------+---------------+---------+-----------+----------+--------------+   Right Technical Findings: A non-vascular, cystic structure is noted in the right popliteal fossa area measuring 4.8 x 2.0 cm.  +----+---------------+---------+-----------+----------+--------------+ LEFTCompressibilityPhasicitySpontaneityPropertiesThrombus Aging +----+---------------+---------+-----------+----------+--------------+ CFV Full           Yes      Yes                                 +----+---------------+---------+-----------+----------+--------------+     Summary: RIGHT: -  No evidence of deep vein thrombosis in the lower extremity. No indirect evidence of obstruction proximal to the inguinal ligament. - A cystic structure is found in the popliteal fossa.  LEFT: - No evidence of common femoral vein obstruction.  *See table(s) above for measurements and observations. Electronically signed by Dina Rich MD on 03/12/2020 at 12:14:42 PM.    Final     Assessment & Plan:   Problem List Items Addressed This Visit     Hypertension, uncontrolled    New rash on arms Off everything except for Norvasc Pt stopped Exforge and Carvedilol - the rah is better  D/c Coreg since it was the latest Rx  Stop Coreg since it was the latest Rx Start Valsartan 1 a day Continue Amlodipine      Rash    D/c Coreg since it was the latest Rx Restart Exforge  Triamc cream         Meds ordered this encounter  Medications   triamcinolone cream (KENALOG) 0.1 %    Sig: Apply 1 Application topically 3 (three) times daily.    Dispense:  80 g    Refill:  3   DISCONTD: valsartan (DIOVAN) 160 MG tablet    Sig: Take 1 tablet (160 mg total) by mouth daily.    Dispense:  90 tablet    Refill:  3      Follow-up: No follow-ups on file.  Sonda Primes, MD

## 2022-04-05 NOTE — Patient Instructions (Addendum)
Stop Coreg since it was the latest Rx Start Valsartan 1 a day Continue Amlodipine

## 2022-04-05 NOTE — Assessment & Plan Note (Signed)
D/c Coreg since it was the latest Rx Restart Exforge  Triamc cream

## 2022-04-05 NOTE — Assessment & Plan Note (Addendum)
New rash on arms Off everything except for Norvasc Pt stopped Exforge and Carvedilol - the rah is better  D/c Coreg since it was the latest Rx  Stop Coreg since it was the latest Rx Start Valsartan 1 a day Continue Amlodipine

## 2022-04-07 ENCOUNTER — Encounter: Payer: Self-pay | Admitting: Internal Medicine

## 2022-04-12 ENCOUNTER — Encounter: Payer: Self-pay | Admitting: Internal Medicine

## 2022-04-16 ENCOUNTER — Encounter: Payer: Self-pay | Admitting: Internal Medicine

## 2022-04-16 ENCOUNTER — Other Ambulatory Visit: Payer: Self-pay | Admitting: Internal Medicine

## 2022-04-16 MED ORDER — TRIAMTERENE-HCTZ 37.5-25 MG PO TABS: 1.0000 | ORAL_TABLET | Freq: Every day | ORAL | 11 refills | Status: DC

## 2022-04-16 NOTE — Progress Notes (Signed)
Rx

## 2022-04-26 ENCOUNTER — Other Ambulatory Visit: Payer: Self-pay | Admitting: Internal Medicine

## 2022-04-28 NOTE — Telephone Encounter (Signed)
Patient is calling in stating that the hives are still present and wants to know what to do about them. Tina Rivera states they have been present since starting the medication.

## 2022-05-09 ENCOUNTER — Other Ambulatory Visit: Payer: Self-pay | Admitting: Family Medicine

## 2022-05-09 DIAGNOSIS — I10 Essential (primary) hypertension: Secondary | ICD-10-CM

## 2022-08-09 ENCOUNTER — Other Ambulatory Visit: Payer: Self-pay | Admitting: Internal Medicine

## 2022-08-09 DIAGNOSIS — I10 Essential (primary) hypertension: Secondary | ICD-10-CM

## 2022-09-06 ENCOUNTER — Encounter: Payer: Self-pay | Admitting: Internal Medicine

## 2022-09-29 ENCOUNTER — Other Ambulatory Visit: Payer: Self-pay | Admitting: *Deleted

## 2022-09-29 MED ORDER — SYNTHROID 75 MCG PO TABS: ORAL_TABLET | ORAL | 0 refills | Status: DC

## 2023-01-18 ENCOUNTER — Other Ambulatory Visit: Payer: Self-pay | Admitting: Internal Medicine

## 2023-01-27 ENCOUNTER — Other Ambulatory Visit: Payer: Self-pay | Admitting: Internal Medicine

## 2023-02-01 ENCOUNTER — Other Ambulatory Visit: Payer: Self-pay | Admitting: Internal Medicine

## 2023-02-07 ENCOUNTER — Encounter: Payer: Self-pay | Admitting: Internal Medicine

## 2023-02-07 ENCOUNTER — Ambulatory Visit (INDEPENDENT_AMBULATORY_CARE_PROVIDER_SITE_OTHER): Payer: BC Managed Care – PPO | Admitting: Internal Medicine

## 2023-02-07 VITALS — BP 120/78 | HR 83 | Temp 98.6°F | Ht 63.5 in | Wt 156.0 lb

## 2023-02-07 DIAGNOSIS — R748 Abnormal levels of other serum enzymes: Secondary | ICD-10-CM | POA: Diagnosis not present

## 2023-02-07 DIAGNOSIS — Z23 Encounter for immunization: Secondary | ICD-10-CM | POA: Diagnosis not present

## 2023-02-07 DIAGNOSIS — Z0000 Encounter for general adult medical examination without abnormal findings: Secondary | ICD-10-CM | POA: Diagnosis not present

## 2023-02-07 DIAGNOSIS — N183 Chronic kidney disease, stage 3 unspecified: Secondary | ICD-10-CM

## 2023-02-07 DIAGNOSIS — E034 Atrophy of thyroid (acquired): Secondary | ICD-10-CM | POA: Diagnosis not present

## 2023-02-07 DIAGNOSIS — Z13220 Encounter for screening for lipoid disorders: Secondary | ICD-10-CM | POA: Diagnosis not present

## 2023-02-07 DIAGNOSIS — Z1211 Encounter for screening for malignant neoplasm of colon: Secondary | ICD-10-CM

## 2023-02-07 DIAGNOSIS — I10 Essential (primary) hypertension: Secondary | ICD-10-CM

## 2023-02-07 NOTE — Assessment & Plan Note (Signed)
Cont on Levothroid 

## 2023-02-07 NOTE — Assessment & Plan Note (Signed)
Check LFT ?

## 2023-02-07 NOTE — Assessment & Plan Note (Signed)
We discussed age appropriate health related issues, including available/recomended screening tests and vaccinations. We discussed a need for adhering to healthy diet and exercise. Labs/EKG were reviewed/ordered. All questions were answered. Colon 2013 - she had a problem with IV sedation "burning" all over. Due in 2023: Cologuard ordered Shingrix discussed

## 2023-02-07 NOTE — Assessment & Plan Note (Signed)
  Hydrate well Nephrol ref was offered again

## 2023-02-07 NOTE — Addendum Note (Signed)
Addended by: Levonne Lapping on: 02/07/2023 04:41 PM   Modules accepted: Orders

## 2023-02-07 NOTE — Progress Notes (Signed)
Subjective:  Patient ID: Tina Rivera, female    DOB: February 27, 1961  Age: 62 y.o. MRN: [id number]  CC: Annual Exam   HPI [name] presents for well exam  Outpatient Medications Prior to Visit  Medication Sig Dispense Refill   acetaminophen (TYLENOL) 500 MG tablet Take 1,000 mg by mouth as needed for mild pain or headache.     amLODipine (NORVASC) 10 MG tablet TAKE 1 TABLET BY MOUTH EVERY DAY 90 tablet 2   Cholecalciferol (VITAMIN D3) 1000 UNITS CAPS Take 1,000 Units by mouth daily.     fluticasone (FLONASE) 50 MCG/ACT nasal spray Place 2 sprays into both nostrils daily. 16 g 6   ketotifen (ZADITOR) 0.025 % ophthalmic solution Place 1 drop into both eyes as needed (itchy eyes).     naproxen (NAPROSYN) 500 MG tablet TAKE 1 TABLET (500 MG TOTAL) BY MOUTH 2 (TWO) TIMES DAILY AS NEEDED (USE PC).     potassium chloride SA (KLOR-CON M20) 20 MEQ tablet Take 1 tablet (20 mEq total) by mouth daily. Annual appt due in Oct must see provider for future refills 90 tablet 0   SYNTHROID 75 MCG tablet TAKE 1 TABLET DAILY BEFORE BREAKFAST (BRAND) 90 tablet 3   triamcinolone cream (KENALOG) 0.1 % Apply 1 Application topically 3 (three) times daily. 80 g 3   triamterene-hydrochlorothiazide (MAXZIDE-25) 37.5-25 MG tablet Take 1 tablet by mouth daily. Overdue for Annual appt w/labs must see provider for future refills 30 tablet 0   vitamin E 400 UNIT capsule Take 400 Units by mouth daily.     No facility-administered medications prior to visit.    ROS: Review of Systems  Constitutional:  Negative for activity change, appetite change, chills, fatigue and unexpected weight change.  HENT:  Negative for congestion, mouth sores and sinus pressure.   Eyes:  Negative for visual disturbance.  Respiratory:  Negative for cough and chest tightness.   Gastrointestinal:  Negative for abdominal pain and nausea.  Genitourinary:  Negative for difficulty urinating, frequency and vaginal pain.  Musculoskeletal:   Negative for back pain and gait problem.  Skin:  Negative for pallor and rash.  Neurological:  Negative for dizziness, tremors, weakness, numbness and headaches.  Psychiatric/Behavioral:  Negative for confusion and sleep disturbance.     Objective:  BP 120/78 (BP Location: Right Arm, Patient Position: Sitting, Cuff Size: Large)   Pulse 83   Temp 98.6 F (37 C) (Oral)   Ht 5' 3.5" (1.613 m)   Wt 156 lb (70.8 kg)   LMP 02/05/2008   SpO2 98%   BMI 27.20 kg/m   BP Readings from Last 3 Encounters:  02/07/23 120/78  04/05/22 (!) 138/98  03/21/22 (!) 152/100    Wt Readings from Last 3 Encounters:  02/07/23 156 lb (70.8 kg)  03/21/22 156 lb (70.8 kg)  02/02/22 154 lb (69.9 kg)    Physical Exam Constitutional:      General: She is not in acute distress.    Appearance: Normal appearance. She is well-developed.  HENT:     Head: Normocephalic.     Right Ear: External ear normal.     Left Ear: External ear normal.     Nose: Nose normal.  Eyes:     General:        Right eye: No discharge.        Left eye: No discharge.     Conjunctiva/sclera: Conjunctivae normal.     Pupils: Pupils are equal, round, and reactive to  light.  Neck:     Thyroid: No thyromegaly.     Vascular: No JVD.     Trachea: No tracheal deviation.  Cardiovascular:     Rate and Rhythm: Normal rate and regular rhythm.     Heart sounds: Normal heart sounds.  Pulmonary:     Effort: No respiratory distress.     Breath sounds: No stridor. No wheezing.  Abdominal:     General: Bowel sounds are normal. There is no distension.     Palpations: Abdomen is soft. There is no mass.     Tenderness: There is no abdominal tenderness. There is no guarding or rebound.  Musculoskeletal:        General: No tenderness.     Cervical back: Normal range of motion and neck supple. No rigidity.  Lymphadenopathy:     Cervical: No cervical adenopathy.  Skin:    Findings: No erythema or rash.  Neurological:     Mental Status:  She is oriented to person, place, and time.     Cranial Nerves: No cranial nerve deficit.     Motor: No abnormal muscle tone.     Coordination: Coordination normal.     Deep Tendon Reflexes: Reflexes normal.  Psychiatric:        Behavior: Behavior normal.        Thought Content: Thought content normal.        Judgment: Judgment normal.     Lab Results  Component Value Date   WBC 6.0 04/14/2021   HGB 12.2 04/14/2021   HCT 36.4 04/14/2021   PLT 268.0 04/14/2021   GLUCOSE 90 11/10/2021   CHOL 152 04/14/2021   TRIG 59.0 04/14/2021   HDL 82.80 04/14/2021   LDLCALC 58 04/14/2021   ALT 27 11/10/2021   AST 25 11/10/2021   NA 141 11/10/2021   K 4.0 11/10/2021   CL 105 11/10/2021   CREATININE 1.06 11/10/2021   BUN 10 11/10/2021   CO2 28 11/10/2021   TSH 0.35 07/21/2021    VAS [place] LOWER EXTREMITY VENOUS (DVT)  Result Date: 03/12/2020  Lower Venous DVTStudy Other Indications: Patient presents with right knee and lower leg tenderness and                    swelling. She injured this leg about 3 months ago but it has                    been okay after that. She jumped rope recently and said a few                    days later is started to be painful behind the knee and she                    noticed the swelling proximal to and below the knee. The knee                    pops with walking and when getting up and down which has been                    painful. Risk Factors: None identified. Performing Technologist: Olegario Shearer RVT  Examination Guidelines: A complete evaluation includes B-mode imaging, spectral Doppler, color Doppler, and power Doppler as needed of all accessible portions of each vessel. Bilateral testing is considered an integral part of a complete examination. Limited examinations for reoccurring indications may be performed  as noted. The reflux portion of the exam is performed with the patient in reverse Trendelenburg.   +---------+---------------+---------+-----------+----------+--------------+ RIGHT    CompressibilityPhasicitySpontaneityPropertiesThrombus Aging +---------+---------------+---------+-----------+----------+--------------+ CFV      Full           Yes      Yes                                 +---------+---------------+---------+-----------+----------+--------------+ SFJ      Full           Yes      Yes                                 +---------+---------------+---------+-----------+----------+--------------+ FV Prox  Full           Yes      Yes                                 +---------+---------------+---------+-----------+----------+--------------+ FV Mid   Full           Yes      Yes                                 +---------+---------------+---------+-----------+----------+--------------+ FV DistalFull           Yes      Yes                                 +---------+---------------+---------+-----------+----------+--------------+ PFV      Full                                                        +---------+---------------+---------+-----------+----------+--------------+ POP      Full           Yes      Yes                                 +---------+---------------+---------+-----------+----------+--------------+ PTV      Full           Yes      Yes                                 +---------+---------------+---------+-----------+----------+--------------+ PERO     Full           Yes      Yes                                 +---------+---------------+---------+-----------+----------+--------------+ Gastroc  Full                                                        +---------+---------------+---------+-----------+----------+--------------+ GSV      Full  Yes      Yes                                 +---------+---------------+---------+-----------+----------+--------------+   Right Technical Findings: A non-vascular,  cystic structure is noted in the right popliteal fossa area measuring 4.8 x 2.0 cm.  +----+---------------+---------+-----------+----------+--------------+ LEFTCompressibilityPhasicitySpontaneityPropertiesThrombus Aging +----+---------------+---------+-----------+----------+--------------+ CFV Full           Yes      Yes                                 +----+---------------+---------+-----------+----------+--------------+     Summary: RIGHT: - No evidence of deep vein thrombosis in the lower extremity. No indirect evidence of obstruction proximal to the inguinal ligament. - A cystic structure is found in the popliteal fossa.  LEFT: - No evidence of common femoral vein obstruction.  *See table(s) above for measurements and observations. Electronically signed by Dina Rich MD on 03/12/2020 at 12:14:42 PM.    Final     Assessment & Plan:   Problem List Items Addressed This Visit     Hypothyroidism    Cont on Levothroid      Hypertension, uncontrolled    No rashes on current BP meds           Well adult exam    We discussed age appropriate health related issues, including available/recomended screening tests and vaccinations. We discussed a need for adhering to healthy diet and exercise. Labs/EKG were reviewed/ordered. All questions were answered. Colon 2013 - she had a problem with IV sedation "burning" all over. Due in 2023: Cologuard ordered Shingrix discussed      Relevant Orders   TSH   Urinalysis   CBC with Differential/Platelet   Lipid panel   Comprehensive metabolic panel   Elevated liver enzymes    Check LFT      CRI (chronic renal insufficiency), stage 3 (moderate) (HCC) - Primary     Hydrate well Nephrol ref was offered again      Other Visit Diagnoses     Screening for colon cancer       Relevant Orders   Cologuard         No orders of the defined types were placed in this encounter.     Follow-up: Return in about 6 months (around  08/10/2023) for a follow-up visit.  Sonda Primes, MD

## 2023-02-07 NOTE — Assessment & Plan Note (Signed)
No rashes on current BP meds

## 2023-02-08 LAB — URINALYSIS
Bilirubin Urine: NEGATIVE
Hgb urine dipstick: NEGATIVE
Ketones, ur: NEGATIVE
Leukocytes,Ua: NEGATIVE
Nitrite: NEGATIVE
Specific Gravity, Urine: <=1.005 — AB (ref 1.000–1.030)
Total Protein, Urine: NEGATIVE
Urine Glucose: NEGATIVE
Urobilinogen, UA: 0.2 (ref 0.0–1.0)
pH: 6 (ref 5.0–8.0)

## 2023-02-08 LAB — CBC WITH DIFFERENTIAL/PLATELET
Basophils Absolute: 0.1 10*3/uL (ref 0.0–0.1)
Basophils Relative: 1.8 % (ref 0.0–3.0)
Eosinophils Absolute: 0.2 10*3/uL (ref 0.0–0.7)
Eosinophils Relative: 2.8 % (ref 0.0–5.0)
HCT: 35.7 % — ABNORMAL LOW (ref 36.0–46.0)
Hemoglobin: 11.7 g/dL — ABNORMAL LOW (ref 12.0–15.0)
Lymphocytes Relative: 45.7 % (ref 12.0–46.0)
Lymphs Abs: 2.5 10*3/uL (ref 0.7–4.0)
MCHC: 32.7 g/dL (ref 30.0–36.0)
MCV: 81.8 fl (ref 78.0–100.0)
Monocytes Absolute: 0.7 10*3/uL (ref 0.1–1.0)
Monocytes Relative: 12 % (ref 3.0–12.0)
Neutro Abs: 2.1 10*3/uL (ref 1.4–7.7)
Neutrophils Relative %: 37.7 % — ABNORMAL LOW (ref 43.0–77.0)
Platelets: 229 10*3/uL (ref 150.0–400.0)
RBC: 4.36 Mil/uL (ref 3.87–5.11)
RDW: 14.8 % (ref 11.5–15.5)
WBC: 5.6 10*3/uL (ref 4.0–10.5)

## 2023-02-08 LAB — COMPREHENSIVE METABOLIC PANEL
ALT: 13 U/L (ref 0–35)
AST: 22 U/L (ref 0–37)
Albumin: 4.2 g/dL (ref 3.5–5.2)
Alkaline Phosphatase: 78 U/L (ref 39–117)
BUN: 12 mg/dL (ref 6–23)
CO2: 30 meq/L (ref 19–32)
Calcium: 9.7 mg/dL (ref 8.4–10.5)
Chloride: 102 meq/L (ref 96–112)
Creatinine, Ser: 1.24 mg/dL — ABNORMAL HIGH (ref 0.40–1.20)
GFR: 47.08 mL/min — ABNORMAL LOW (ref >60.00)
Glucose, Bld: 87 mg/dL (ref 70–99)
Potassium: 3.2 meq/L — ABNORMAL LOW (ref 3.5–5.1)
Sodium: 140 meq/L (ref 135–145)
Total Bilirubin: 0.3 mg/dL (ref 0.2–1.2)
Total Protein: 7.4 g/dL (ref 6.0–8.3)

## 2023-02-08 LAB — LIPID PANEL
Cholesterol: 159 mg/dL (ref 0–200)
HDL: 76.9 mg/dL (ref >39.00)
LDL Cholesterol: 70 mg/dL (ref 0–99)
NonHDL: 81.93
Total CHOL/HDL Ratio: 2
Triglycerides: 61 mg/dL (ref 0.0–149.0)
VLDL: 12.2 mg/dL (ref 0.0–40.0)

## 2023-02-09 LAB — TSH: TSH: 0.45 u[IU]/mL (ref 0.35–5.50)

## 2023-02-11 ENCOUNTER — Encounter: Payer: Self-pay | Admitting: Internal Medicine

## 2023-02-11 ENCOUNTER — Other Ambulatory Visit: Payer: Self-pay | Admitting: Internal Medicine

## 2023-02-11 DIAGNOSIS — I10 Essential (primary) hypertension: Secondary | ICD-10-CM

## 2023-02-11 DIAGNOSIS — N183 Chronic kidney disease, stage 3 unspecified: Secondary | ICD-10-CM

## 2023-02-17 LAB — COLOGUARD: COLOGUARD: NEGATIVE

## 2023-03-21 ENCOUNTER — Encounter: Payer: Self-pay | Admitting: Internal Medicine

## 2023-04-25 ENCOUNTER — Other Ambulatory Visit: Payer: Self-pay | Admitting: Internal Medicine

## 2023-04-26 MED ORDER — TRIAMTERENE-HCTZ 37.5-25 MG PO TABS: 1.0000 | ORAL_TABLET | Freq: Every day | ORAL | 0 refills | Status: DC

## 2023-05-01 LAB — LAB REPORT - SCANNED
Albumin, Urine POC: 5.3
Albumin/Creatinine Ratio, Urine, POC: 5

## 2023-05-02 ENCOUNTER — Other Ambulatory Visit: Payer: Self-pay | Admitting: Internal Medicine

## 2023-05-02 DIAGNOSIS — I10 Essential (primary) hypertension: Secondary | ICD-10-CM

## 2023-05-24 ENCOUNTER — Other Ambulatory Visit: Payer: Self-pay | Admitting: Internal Medicine

## 2023-06-22 ENCOUNTER — Other Ambulatory Visit: Payer: Self-pay | Admitting: Internal Medicine

## 2023-07-21 ENCOUNTER — Other Ambulatory Visit: Payer: Self-pay | Admitting: Internal Medicine

## 2023-08-10 ENCOUNTER — Ambulatory Visit: Payer: Self-pay | Admitting: Internal Medicine

## 2023-08-19 ENCOUNTER — Other Ambulatory Visit: Payer: Self-pay | Admitting: Internal Medicine

## 2023-09-12 ENCOUNTER — Encounter: Payer: Self-pay | Admitting: Internal Medicine

## 2023-09-12 ENCOUNTER — Ambulatory Visit: Payer: Self-pay | Admitting: Internal Medicine

## 2023-09-12 VITALS — BP 130/80 | HR 66 | Temp 97.8°F | Ht 63.5 in | Wt 169.0 lb

## 2023-09-12 DIAGNOSIS — E034 Atrophy of thyroid (acquired): Secondary | ICD-10-CM | POA: Diagnosis not present

## 2023-09-12 DIAGNOSIS — I10 Essential (primary) hypertension: Secondary | ICD-10-CM

## 2023-09-12 DIAGNOSIS — E0500 Thyrotoxicosis with diffuse goiter without thyrotoxic crisis or storm: Secondary | ICD-10-CM | POA: Diagnosis not present

## 2023-09-12 MED ORDER — TRIAMTERENE-HCTZ 37.5-25 MG PO TABS: 1.0000 | ORAL_TABLET | Freq: Every day | ORAL | 3 refills | Status: AC

## 2023-09-12 MED ORDER — POTASSIUM CHLORIDE CRYS ER 20 MEQ PO TBCR: 20.0000 meq | EXTENDED_RELEASE_TABLET | Freq: Once | ORAL | 3 refills | Status: DC

## 2023-09-12 NOTE — Assessment & Plan Note (Addendum)
 Continue on valsartan , amlodipine .  No rashes on current BP meds

## 2023-09-12 NOTE — Progress Notes (Signed)
 Subjective:  Patient ID: Tina Rivera, female    DOB: [birth year]  Age: 63 y.o. MRN: [id number]  CC: Medical Management of Chronic Issues   HPI Pranika Finks Si presents for CRI, HTN, low K Stress at home  Outpatient Medications Prior to Visit  Medication Sig Dispense Refill   acetaminophen  (TYLENOL ) 500 MG tablet Take 1,000 mg by mouth as needed for mild pain or headache.     amLODipine  (NORVASC ) 10 MG tablet TAKE 1 TABLET BY MOUTH EVERY DAY 90 tablet 2   Cholecalciferol (VITAMIN D3) 1000 UNITS CAPS Take 1,000 Units by mouth daily.     fluticasone  (FLONASE ) 50 MCG/ACT nasal spray Place 2 sprays into both nostrils daily. 16 g 6   ketotifen (ZADITOR) 0.025 % ophthalmic solution Place 1 drop into both eyes as needed (itchy eyes).     naproxen  (NAPROSYN ) 500 MG tablet TAKE 1 TABLET (500 MG TOTAL) BY MOUTH 2 (TWO) TIMES DAILY AS NEEDED (USE PC).     SYNTHROID  75 MCG tablet TAKE 1 TABLET DAILY BEFORE BREAKFAST (BRAND) 90 tablet 3   triamcinolone  cream (KENALOG ) 0.1 % Apply 1 Application topically 3 (three) times daily. 80 g 3   vitamin E 400 UNIT capsule Take 400 Units by mouth daily.     KLOR-CON  M20 20 MEQ tablet PLEASE SEE ATTACHED FOR DETAILED DIRECTIONS 90 tablet 0   triamterene -hydrochlorothiazide  (MAXZIDE-25) 37.5-25 MG tablet TAKE 1 TABLET BY MOUTH DAILY. OVERDUE FOR ANNUAL APPT W/LABS MUST SEE PROVIDER FOR FUTURE REFILLS 30 tablet 0   No facility-administered medications prior to visit.    ROS: Review of Systems  Constitutional:  Negative for activity change, appetite change, chills, fatigue and unexpected weight change.  HENT:  Negative for congestion, mouth sores and sinus pressure.   Eyes:  Negative for visual disturbance.  Respiratory:  Negative for cough and chest tightness.   Gastrointestinal:  Negative for abdominal pain and nausea.  Genitourinary:  Negative for difficulty urinating, frequency and vaginal pain.  Musculoskeletal:  Negative for back pain and gait  problem.  Skin:  Negative for pallor and rash.  Neurological:  Negative for dizziness, tremors, weakness, numbness and headaches.  Psychiatric/Behavioral:  Negative for confusion and sleep disturbance.     Objective:  BP 130/80   Pulse 66   Temp 97.8 F (36.6 C)   Ht 5' 3.5" (1.613 m)   Wt 169 lb (76.7 kg)   LMP 02/05/2008   SpO2 99%   BMI 29.47 kg/m   BP Readings from Last 3 Encounters:  09/12/23 130/80  02/07/23 120/78  04/05/22 (!) 138/98    Wt Readings from Last 3 Encounters:  09/12/23 169 lb (76.7 kg)  02/07/23 156 lb (70.8 kg)  03/21/22 156 lb (70.8 kg)    Physical Exam Constitutional:      General: She is not in acute distress.    Appearance: Normal appearance. She is well-developed.  HENT:     Head: Normocephalic.     Right Ear: External ear normal.     Left Ear: External ear normal.     Nose: Nose normal.  Eyes:     General:        Right eye: No discharge.        Left eye: No discharge.     Conjunctiva/sclera: Conjunctivae normal.     Pupils: Pupils are equal, round, and reactive to light.  Neck:     Thyroid : No thyromegaly.     Vascular: No JVD.  Trachea: No tracheal deviation.  Cardiovascular:     Rate and Rhythm: Normal rate and regular rhythm.     Heart sounds: Normal heart sounds.  Pulmonary:     Effort: No respiratory distress.     Breath sounds: No stridor. No wheezing.  Abdominal:     General: Bowel sounds are normal. There is no distension.     Palpations: Abdomen is soft. There is no mass.     Tenderness: There is no abdominal tenderness. There is no guarding or rebound.  Musculoskeletal:        General: No tenderness.     Cervical back: Normal range of motion and neck supple. No rigidity.  Lymphadenopathy:     Cervical: No cervical adenopathy.  Skin:    Findings: No erythema or rash.  Neurological:     Cranial Nerves: No cranial nerve deficit.     Motor: No abnormal muscle tone.     Coordination: Coordination normal.      Deep Tendon Reflexes: Reflexes normal.  Psychiatric:        Behavior: Behavior normal.        Thought Content: Thought content normal.        Judgment: Judgment normal.     Lab Results  Component Value Date   WBC 6.1 09/14/2023   HGB 11.7 (L) 09/14/2023   HCT 35.5 (L) 09/14/2023   PLT 257.0 09/14/2023   GLUCOSE 91 09/14/2023   CHOL 159 02/07/2023   TRIG 61.0 02/07/2023   HDL 76.90 02/07/2023   LDLCALC 70 02/07/2023   ALT 11 09/14/2023   AST 18 09/14/2023   NA 138 09/14/2023   K 3.6 09/14/2023   CL 101 09/14/2023   CREATININE 1.09 09/14/2023   BUN 16 09/14/2023   CO2 29 09/14/2023   TSH 0.45 02/07/2023    VAS US  LOWER EXTREMITY VENOUS (DVT) Result Date: 03/12/2020  Lower Venous DVTStudy Other Indications: Patient presents with right knee and lower leg tenderness and                    swelling. She injured this leg about 3 months ago but it has                    been okay after that. She jumped rope recently and said a few                    days later is started to be painful behind the knee and she                    noticed the swelling proximal to and below the knee. The knee                    pops with walking and when getting up and down which has been                    painful. Risk Factors: None identified. Performing Technologist: Harless Lien RVT  Examination Guidelines: A complete evaluation includes B-mode imaging, spectral Doppler, color Doppler, and power Doppler as needed of all accessible portions of each vessel. Bilateral testing is considered an integral part of a complete examination. Limited examinations for reoccurring indications may be performed as noted. The reflux portion of the exam is performed with the patient in reverse Trendelenburg.  +---------+---------------+---------+-----------+----------+--------------+ RIGHT    CompressibilityPhasicitySpontaneityPropertiesThrombus Aging  +---------+---------------+---------+-----------+----------+--------------+ CFV      Full  Yes      Yes                                 +---------+---------------+---------+-----------+----------+--------------+ SFJ      Full           Yes      Yes                                 +---------+---------------+---------+-----------+----------+--------------+ FV Prox  Full           Yes      Yes                                 +---------+---------------+---------+-----------+----------+--------------+ FV Mid   Full           Yes      Yes                                 +---------+---------------+---------+-----------+----------+--------------+ FV DistalFull           Yes      Yes                                 +---------+---------------+---------+-----------+----------+--------------+ PFV      Full                                                        +---------+---------------+---------+-----------+----------+--------------+ POP      Full           Yes      Yes                                 +---------+---------------+---------+-----------+----------+--------------+ PTV      Full           Yes      Yes                                 +---------+---------------+---------+-----------+----------+--------------+ PERO     Full           Yes      Yes                                 +---------+---------------+---------+-----------+----------+--------------+ Gastroc  Full                                                        +---------+---------------+---------+-----------+----------+--------------+ GSV      Full           Yes      Yes                                 +---------+---------------+---------+-----------+----------+--------------+  Right Technical Findings: A non-vascular, cystic structure is noted in the right popliteal fossa area measuring 4.8 x 2.0 cm.  +----+---------------+---------+-----------+----------+--------------+  LEFTCompressibilityPhasicitySpontaneityPropertiesThrombus Aging +----+---------------+---------+-----------+----------+--------------+ CFV Full           Yes      Yes                                 +----+---------------+---------+-----------+----------+--------------+     Summary: RIGHT: - No evidence of deep vein thrombosis in the lower extremity. No indirect evidence of obstruction proximal to the inguinal ligament. - A cystic structure is found in the popliteal fossa.  LEFT: - No evidence of common femoral vein obstruction.  *See table(s) above for measurements and observations. Electronically signed by Armida Lander MD on 03/12/2020 at 12:14:42 PM.    Final     Assessment & Plan:   Problem List Items Addressed This Visit     GRAVES DISEASE   Hypothyroid.  Continue Synthroid  75 mcg daily.  Monitor TSH      Hypothyroidism - Primary   Cont on Levothroid      Hypertension, uncontrolled   Continue on valsartan , amlodipine .  No rashes on current BP meds           Relevant Medications   triamterene -hydrochlorothiazide  (MAXZIDE-25) 37.5-25 MG tablet   Other Relevant Orders   CBC with Differential/Platelet   Comprehensive metabolic panel   Magnesium      Meds ordered this encounter  Medications   potassium chloride  SA (KLOR-CON  M20) 20 MEQ tablet    Sig: Take 1 tablet (20 mEq total) by mouth once for 1 dose.    Dispense:  90 tablet    Refill:  3   triamterene -hydrochlorothiazide  (MAXZIDE-25) 37.5-25 MG tablet    Sig: Take 1 tablet by mouth daily. Overdue for Annual appt w/labs must see provider for future refills    Dispense:  90 tablet    Refill:  3      Follow-up: Return in about 3 months (around 12/13/2023) for a follow-up visit.  Anitra Barn, MD

## 2023-09-14 ENCOUNTER — Other Ambulatory Visit: Payer: Self-pay

## 2023-09-14 ENCOUNTER — Other Ambulatory Visit: Payer: Self-pay | Admitting: Internal Medicine

## 2023-09-14 DIAGNOSIS — I10 Essential (primary) hypertension: Secondary | ICD-10-CM

## 2023-09-14 LAB — COMPREHENSIVE METABOLIC PANEL WITH GFR
ALT: 11 U/L (ref 0–35)
AST: 18 U/L (ref 0–37)
Albumin: 4.4 g/dL (ref 3.5–5.2)
Alkaline Phosphatase: 84 U/L (ref 39–117)
BUN: 16 mg/dL (ref 6–23)
CO2: 29 meq/L (ref 19–32)
Calcium: 9.7 mg/dL (ref 8.4–10.5)
Chloride: 101 meq/L (ref 96–112)
Creatinine, Ser: 1.09 mg/dL (ref 0.40–1.20)
GFR: 54.73 mL/min — ABNORMAL LOW (ref >60.00)
Glucose, Bld: 91 mg/dL (ref 70–99)
Potassium: 3.6 meq/L (ref 3.5–5.1)
Sodium: 138 meq/L (ref 135–145)
Total Bilirubin: 0.3 mg/dL (ref 0.2–1.2)
Total Protein: 7.4 g/dL (ref 6.0–8.3)

## 2023-09-14 LAB — CBC WITH DIFFERENTIAL/PLATELET
Basophils Absolute: 0 10*3/uL (ref 0.0–0.1)
Basophils Relative: 0.6 % (ref 0.0–3.0)
Eosinophils Absolute: 0.1 10*3/uL (ref 0.0–0.7)
Eosinophils Relative: 1.9 % (ref 0.0–5.0)
HCT: 35.5 % — ABNORMAL LOW (ref 36.0–46.0)
Hemoglobin: 11.7 g/dL — ABNORMAL LOW (ref 12.0–15.0)
Lymphocytes Relative: 44.1 % (ref 12.0–46.0)
Lymphs Abs: 2.7 10*3/uL (ref 0.7–4.0)
MCHC: 32.9 g/dL (ref 30.0–36.0)
MCV: 82.9 fl (ref 78.0–100.0)
Monocytes Absolute: 0.7 10*3/uL (ref 0.1–1.0)
Monocytes Relative: 11.9 % (ref 3.0–12.0)
Neutro Abs: 2.5 10*3/uL (ref 1.4–7.7)
Neutrophils Relative %: 41.5 % — ABNORMAL LOW (ref 43.0–77.0)
Platelets: 257 10*3/uL (ref 150.0–400.0)
RBC: 4.28 Mil/uL (ref 3.87–5.11)
RDW: 14.9 % (ref 11.5–15.5)
WBC: 6.1 10*3/uL (ref 4.0–10.5)

## 2023-09-14 LAB — MAGNESIUM: Magnesium: 1.8 mg/dL (ref 1.5–2.5)

## 2023-09-14 NOTE — Progress Notes (Signed)
 Updated orders.

## 2023-09-17 ENCOUNTER — Encounter: Payer: Self-pay | Admitting: Internal Medicine

## 2023-10-07 NOTE — Assessment & Plan Note (Signed)
Cont on Levothroid 

## 2023-10-07 NOTE — Assessment & Plan Note (Signed)
 Hypothyroid.  Continue Synthroid  75 mcg daily.  Monitor TSH

## 2023-12-23 ENCOUNTER — Other Ambulatory Visit: Payer: Self-pay | Admitting: Internal Medicine

## 2024-01-26 ENCOUNTER — Other Ambulatory Visit: Payer: Self-pay | Admitting: Internal Medicine

## 2024-01-26 DIAGNOSIS — I10 Essential (primary) hypertension: Secondary | ICD-10-CM

## 2024-02-11 ENCOUNTER — Encounter: Payer: Self-pay | Admitting: Internal Medicine

## 2024-02-11 ENCOUNTER — Ambulatory Visit (INDEPENDENT_AMBULATORY_CARE_PROVIDER_SITE_OTHER): Admitting: Internal Medicine

## 2024-02-11 VITALS — BP 124/80 | HR 80 | Temp 98.6°F | Ht 63.5 in | Wt 172.0 lb

## 2024-02-11 DIAGNOSIS — Z0000 Encounter for general adult medical examination without abnormal findings: Secondary | ICD-10-CM | POA: Diagnosis not present

## 2024-02-11 DIAGNOSIS — D1720 Benign lipomatous neoplasm of skin and subcutaneous tissue of unspecified limb: Secondary | ICD-10-CM | POA: Diagnosis not present

## 2024-02-11 DIAGNOSIS — E034 Atrophy of thyroid (acquired): Secondary | ICD-10-CM | POA: Diagnosis not present

## 2024-02-11 DIAGNOSIS — N183 Chronic kidney disease, stage 3 unspecified: Secondary | ICD-10-CM | POA: Diagnosis not present

## 2024-02-11 DIAGNOSIS — Z13220 Encounter for screening for lipoid disorders: Secondary | ICD-10-CM

## 2024-02-11 LAB — LIPID PANEL
Cholesterol: 158 mg/dL (ref 0–200)
HDL: 82.4 mg/dL (ref >39.00)
LDL Cholesterol: 62 mg/dL (ref 0–99)
NonHDL: 75.92
Total CHOL/HDL Ratio: 2
Triglycerides: 68 mg/dL (ref 0.0–149.0)
VLDL: 13.6 mg/dL (ref 0.0–40.0)

## 2024-02-11 LAB — COMPREHENSIVE METABOLIC PANEL WITH GFR
ALT: 15 U/L (ref 0–35)
AST: 20 U/L (ref 0–37)
Albumin: 4.4 g/dL (ref 3.5–5.2)
Alkaline Phosphatase: 93 U/L (ref 39–117)
BUN: 10 mg/dL (ref 6–23)
CO2: 30 meq/L (ref 19–32)
Calcium: 9.6 mg/dL (ref 8.4–10.5)
Chloride: 103 meq/L (ref 96–112)
Creatinine, Ser: 1.07 mg/dL (ref 0.40–1.20)
GFR: 55.79 mL/min — ABNORMAL LOW (ref >60.00)
Glucose, Bld: 90 mg/dL (ref 70–99)
Potassium: 3.4 meq/L — ABNORMAL LOW (ref 3.5–5.1)
Sodium: 144 meq/L (ref 135–145)
Total Bilirubin: 0.5 mg/dL (ref 0.2–1.2)
Total Protein: 7.2 g/dL (ref 6.0–8.3)

## 2024-02-11 LAB — CBC WITH DIFFERENTIAL/PLATELET
Basophils Absolute: 0 K/uL (ref 0.0–0.1)
Basophils Relative: 0.7 % (ref 0.0–3.0)
Eosinophils Absolute: 0.1 K/uL (ref 0.0–0.7)
Eosinophils Relative: 1.6 % (ref 0.0–5.0)
HCT: 35.6 % — ABNORMAL LOW (ref 36.0–46.0)
Hemoglobin: 11.5 g/dL — ABNORMAL LOW (ref 12.0–15.0)
Lymphocytes Relative: 38.6 % (ref 12.0–46.0)
Lymphs Abs: 2 K/uL (ref 0.7–4.0)
MCHC: 32.5 g/dL (ref 30.0–36.0)
MCV: 81.2 fl (ref 78.0–100.0)
Monocytes Absolute: 0.4 K/uL (ref 0.1–1.0)
Monocytes Relative: 8.5 % (ref 3.0–12.0)
Neutro Abs: 2.7 K/uL (ref 1.4–7.7)
Neutrophils Relative %: 50.6 % (ref 43.0–77.0)
Platelets: 222 K/uL (ref 150.0–400.0)
RBC: 4.38 Mil/uL (ref 3.87–5.11)
RDW: 15.5 % (ref 11.5–15.5)
WBC: 5.2 K/uL (ref 4.0–10.5)

## 2024-02-11 LAB — TSH: TSH: 0.59 u[IU]/mL (ref 0.35–5.50)

## 2024-02-11 MED ORDER — TRIAMCINOLONE ACETONIDE 0.1 % EX CREA: 1.0000 | TOPICAL_CREAM | Freq: Three times a day (TID) | CUTANEOUS | 3 refills | Status: AC

## 2024-02-11 NOTE — Assessment & Plan Note (Signed)
 L inner thigh lipoma - no change in years - 3x2 cm, NT Will watch

## 2024-02-11 NOTE — Assessment & Plan Note (Signed)
 We discussed age appropriate health related issues, including available/recomended screening tests and vaccinations. We discussed a need for adhering to healthy diet and exercise. Labs/EKG were reviewed/ordered. All questions were answered. Colon 2013 - she had a problem with IV sedation burning all over. Due in 2023 Shingrix discussed Cologuard 8/024 (-)

## 2024-02-11 NOTE — Progress Notes (Signed)
 Subjective:  Patient ID: Tina Rivera, female    DOB: 10-26-1960  Age: 63 y.o. MRN: [id number]  CC: Annual Exam   HPI Tina Rivera presents for a well exam  Outpatient Medications Prior to Visit  Medication Sig Dispense Refill   acetaminophen  (TYLENOL ) 500 MG tablet Take 1,000 mg by mouth as needed for mild pain or headache.     amLODipine  (NORVASC ) 10 MG tablet TAKE 1 TABLET BY MOUTH EVERY DAY 90 tablet 2   Cholecalciferol (VITAMIN D3) 1000 UNITS CAPS Take 1,000 Units by mouth daily.     fluticasone  (FLONASE ) 50 MCG/ACT nasal spray Place 2 sprays into both nostrils daily. 16 g 6   ketotifen (ZADITOR) 0.025 % ophthalmic solution Place 1 drop into both eyes as needed (itchy eyes).     levothyroxine  (SYNTHROID ) 75 MCG tablet TAKE 1 TABLET DAILY BEFORE BREAKFAST (BRAND) 90 tablet 3   naproxen  (NAPROSYN ) 500 MG tablet TAKE 1 TABLET (500 MG TOTAL) BY MOUTH 2 (TWO) TIMES DAILY AS NEEDED (USE PC).     potassium chloride  SA (KLOR-CON  M20) 20 MEQ tablet Take 1 tablet (20 mEq total) by mouth once for 1 dose. 90 tablet 3   triamterene -hydrochlorothiazide  (MAXZIDE-25) 37.5-25 MG tablet Take 1 tablet by mouth daily. Overdue for Annual appt w/labs must see provider for future refills 90 tablet 3   vitamin E 400 UNIT capsule Take 400 Units by mouth daily.     triamcinolone  cream (KENALOG ) 0.1 % Apply 1 Application topically 3 (three) times daily. 80 g 3   No facility-administered medications prior to visit.    ROS: Review of Systems  Constitutional:  Negative for activity change, appetite change, chills, fatigue and unexpected weight change.  HENT:  Negative for congestion, mouth sores and sinus pressure.   Eyes:  Negative for visual disturbance.  Respiratory:  Negative for cough and chest tightness.   Gastrointestinal:  Negative for abdominal pain and nausea.  Genitourinary:  Negative for difficulty urinating, frequency and vaginal pain.  Musculoskeletal:  Negative for back pain and  gait problem.  Skin:  Negative for pallor and rash.  Neurological:  Negative for dizziness, tremors, weakness, numbness and headaches.  Psychiatric/Behavioral:  Negative for confusion, sleep disturbance and suicidal ideas.     Objective:  BP 124/80   Pulse 80   Temp 98.6 F (37 C) (Oral)   Ht 5' 3.5 (1.613 m)   Wt 172 lb (78 kg)   LMP 02/05/2008   SpO2 99%   BMI 29.99 kg/m   BP Readings from Last 3 Encounters:  02/11/24 124/80  09/12/23 130/80  02/07/23 120/78    Wt Readings from Last 3 Encounters:  02/11/24 172 lb (78 kg)  09/12/23 169 lb (76.7 kg)  02/07/23 156 lb (70.8 kg)    Physical Exam Constitutional:      General: She is not in acute distress.    Appearance: She is well-developed. She is obese.  HENT:     Head: Normocephalic.     Right Ear: External ear normal.     Left Ear: External ear normal.     Nose: Nose normal.  Eyes:     General:        Right eye: No discharge.        Left eye: No discharge.     Conjunctiva/sclera: Conjunctivae normal.     Pupils: Pupils are equal, round, and reactive to light.  Neck:     Thyroid : No thyromegaly.     Vascular:  No JVD.     Trachea: No tracheal deviation.  Cardiovascular:     Rate and Rhythm: Normal rate and regular rhythm.     Heart sounds: Normal heart sounds.  Pulmonary:     Effort: No respiratory distress.     Breath sounds: No stridor. No wheezing.  Abdominal:     General: Bowel sounds are normal. There is no distension.     Palpations: Abdomen is soft. There is no mass.     Tenderness: There is no abdominal tenderness. There is no guarding or rebound.  Musculoskeletal:        General: No tenderness.     Cervical back: Normal range of motion and neck supple. No rigidity.  Lymphadenopathy:     Cervical: No cervical adenopathy.  Skin:    Findings: No erythema or rash.  Neurological:     Cranial Nerves: No cranial nerve deficit.     Motor: No abnormal muscle tone.     Coordination: Coordination  normal.     Deep Tendon Reflexes: Reflexes normal.  Psychiatric:        Behavior: Behavior normal.        Thought Content: Thought content normal.        Judgment: Judgment normal.   L inner thigh lipoma - no change, 3x2 cm, NT  Lab Results  Component Value Date   WBC 6.1 09/14/2023   HGB 11.7 (L) 09/14/2023   HCT 35.5 (L) 09/14/2023   PLT 257.0 09/14/2023   GLUCOSE 91 09/14/2023   CHOL 159 02/07/2023   TRIG 61.0 02/07/2023   HDL 76.90 02/07/2023   LDLCALC 70 02/07/2023   ALT 11 09/14/2023   AST 18 09/14/2023   NA 138 09/14/2023   K 3.6 09/14/2023   CL 101 09/14/2023   CREATININE 1.09 09/14/2023   BUN 16 09/14/2023   CO2 29 09/14/2023   TSH 0.45 02/07/2023    VAS US  LOWER EXTREMITY VENOUS (DVT) Result Date: 03/12/2020  Lower Venous DVTStudy Other Indications: Patient presents with right knee and lower leg tenderness and                    swelling. She injured this leg about 3 months ago but it has                    been okay after that. She jumped rope recently and said a few                    days later is started to be painful behind the knee and she                    noticed the swelling proximal to and below the knee. The knee                    pops with walking and when getting up and down which has been                    painful. Risk Factors: None identified. Performing Technologist: Edsel Mustard RVT  Examination Guidelines: A complete evaluation includes B-mode imaging, spectral Doppler, color Doppler, and power Doppler as needed of all accessible portions of each vessel. Bilateral testing is considered an integral part of a complete examination. Limited examinations for reoccurring indications may be performed as noted. The reflux portion of the exam is performed with the patient in reverse Trendelenburg.  +---------+---------------+---------+-----------+----------+--------------+ RIGHT  CompressibilityPhasicitySpontaneityPropertiesThrombus Aging  +---------+---------------+---------+-----------+----------+--------------+ CFV      Full           Yes      Yes                                 +---------+---------------+---------+-----------+----------+--------------+ SFJ      Full           Yes      Yes                                 +---------+---------------+---------+-----------+----------+--------------+ FV Prox  Full           Yes      Yes                                 +---------+---------------+---------+-----------+----------+--------------+ FV Mid   Full           Yes      Yes                                 +---------+---------------+---------+-----------+----------+--------------+ FV DistalFull           Yes      Yes                                 +---------+---------------+---------+-----------+----------+--------------+ PFV      Full                                                        +---------+---------------+---------+-----------+----------+--------------+ POP      Full           Yes      Yes                                 +---------+---------------+---------+-----------+----------+--------------+ PTV      Full           Yes      Yes                                 +---------+---------------+---------+-----------+----------+--------------+ PERO     Full           Yes      Yes                                 +---------+---------------+---------+-----------+----------+--------------+ Gastroc  Full                                                        +---------+---------------+---------+-----------+----------+--------------+ GSV      Full           Yes      Yes                                 +---------+---------------+---------+-----------+----------+--------------+  Right Technical Findings: A non-vascular, cystic structure is noted in the right popliteal fossa area measuring 4.8 x 2.0 cm.  +----+---------------+---------+-----------+----------+--------------+  LEFTCompressibilityPhasicitySpontaneityPropertiesThrombus Aging +----+---------------+---------+-----------+----------+--------------+ CFV Full           Yes      Yes                                 +----+---------------+---------+-----------+----------+--------------+     Summary: RIGHT: - No evidence of deep vein thrombosis in the lower extremity. No indirect evidence of obstruction proximal to the inguinal ligament. - A cystic structure is found in the popliteal fossa.  LEFT: - No evidence of common femoral vein obstruction.  *See table(s) above for measurements and observations. Electronically signed by Dorn Ross MD on 03/12/2020 at 12:14:42 PM.    Final     Assessment & Plan:   Problem List Items Addressed This Visit     CRI (chronic renal insufficiency), stage 3 (moderate) (HCC)   Relevant Orders   Microalbumin / creatinine urine ratio   Hypothyroidism   Relevant Orders   TSH   Lipoma of extremity   L inner thigh lipoma - no change in years - 3x2 cm, NT Will watch      Well adult exam - Primary   We discussed age appropriate health related issues, including available/recomended screening tests and vaccinations. We discussed a need for adhering to healthy diet and exercise. Labs/EKG were reviewed/ordered. All questions were answered. Colon 2013 - she had a problem with IV sedation burning all over. Due in 2023 Shingrix discussed Cologuard 8/024 (-)      Relevant Orders   TSH   Urinalysis   CBC with Differential/Platelet   Comprehensive metabolic panel with GFR   Microalbumin / creatinine urine ratio   Lipid panel      Meds ordered this encounter  Medications   triamcinolone  cream (KENALOG ) 0.1 %    Sig: Apply 1 Application topically 3 (three) times daily.    Dispense:  80 g    Refill:  3      Follow-up: Return in about 6 months (around 08/13/2024) for a follow-up visit.  Marolyn Noel, MD

## 2024-02-12 LAB — MICROALBUMIN / CREATININE URINE RATIO
Creatinine,U: 34.5 mg/dL
Microalb Creat Ratio: UNDETERMINED mg/g (ref 0.0–30.0)
Microalb, Ur: <0.7 mg/dL

## 2024-02-12 LAB — URINALYSIS, ROUTINE W REFLEX MICROSCOPIC
Bilirubin Urine: NEGATIVE
Hgb urine dipstick: NEGATIVE
Ketones, ur: NEGATIVE
Nitrite: NEGATIVE
RBC / HPF: NONE SEEN (ref 0–?)
Specific Gravity, Urine: <=1.005 — AB (ref 1.000–1.030)
Total Protein, Urine: NEGATIVE
Urine Glucose: NEGATIVE
Urobilinogen, UA: 0.2 (ref 0.0–1.0)
pH: 6 (ref 5.0–8.0)

## 2024-02-13 ENCOUNTER — Ambulatory Visit: Payer: Self-pay | Admitting: Internal Medicine

## 2024-02-18 ENCOUNTER — Other Ambulatory Visit: Payer: Self-pay | Admitting: Internal Medicine

## 2024-02-18 MED ORDER — POTASSIUM CHLORIDE CRYS ER 20 MEQ PO TBCR: 20.0000 meq | EXTENDED_RELEASE_TABLET | Freq: Two times a day (BID) | ORAL | 3 refills | Status: AC

## 2024-02-18 NOTE — Progress Notes (Signed)
Low potassium

## 2024-02-20 ENCOUNTER — Other Ambulatory Visit: Payer: Self-pay | Admitting: Internal Medicine
